# Patient Record
Sex: Female | Born: 1950
Health system: Southern US, Community
[De-identification: ages and names within clinical notes are randomized; demographics above are authoritative.]

## PROBLEM LIST (undated history)

## (undated) DIAGNOSIS — E785 Hyperlipidemia, unspecified: Secondary | ICD-10-CM

## (undated) DIAGNOSIS — Z9189 Other specified personal risk factors, not elsewhere classified: Secondary | ICD-10-CM

## (undated) DIAGNOSIS — I10 Essential (primary) hypertension: Secondary | ICD-10-CM

## (undated) DIAGNOSIS — Z91B Personal risk factor of exposure to diethylstilbestrol: Secondary | ICD-10-CM

## (undated) DIAGNOSIS — Z87442 Personal history of urinary calculi: Secondary | ICD-10-CM

## (undated) DIAGNOSIS — E08610 Diabetes mellitus due to underlying condition with diabetic neuropathic arthropathy: Secondary | ICD-10-CM

## (undated) DIAGNOSIS — E079 Disorder of thyroid, unspecified: Secondary | ICD-10-CM

## (undated) DIAGNOSIS — G629 Polyneuropathy, unspecified: Secondary | ICD-10-CM

## (undated) HISTORY — DX: Disorder of thyroid, unspecified: E07.9

## (undated) HISTORY — DX: Hyperlipidemia, unspecified: E78.5

## (undated) HISTORY — DX: Personal history of urinary calculi: Z87.442

## (undated) HISTORY — DX: Essential (primary) hypertension: I10

## (undated) HISTORY — DX: Diabetes mellitus due to underlying condition with diabetic neuropathic arthropathy: E08.610

## (undated) HISTORY — DX: Personal risk factor of exposure to diethylstilbestrol: Z91.B

## (undated) HISTORY — DX: Other specified personal risk factors, not elsewhere classified: Z91.89

---

## 1996-01-04 HISTORY — PX: LUMBAR DISC SURGERY: SHX700

## 1997-01-03 DIAGNOSIS — Z87442 Personal history of urinary calculi: Secondary | ICD-10-CM

## 1997-01-03 HISTORY — DX: Personal history of urinary calculi: Z87.442

## 1997-01-03 HISTORY — PX: LUMBAR FUSION: SHX111

## 2004-08-31 ENCOUNTER — Ambulatory Visit: Payer: Self-pay | Admitting: Family Medicine

## 2005-01-03 HISTORY — PX: COLONOSCOPY: SHX174

## 2005-03-28 ENCOUNTER — Ambulatory Visit: Payer: Self-pay | Admitting: Family Medicine

## 2005-04-13 ENCOUNTER — Ambulatory Visit: Payer: Self-pay | Admitting: Gastroenterology

## 2005-04-25 ENCOUNTER — Ambulatory Visit: Payer: Self-pay | Admitting: Gastroenterology

## 2005-05-04 ENCOUNTER — Encounter: Admission: RE | Admit: 2005-05-04 | Discharge: 2005-05-04 | Payer: Self-pay | Admitting: Family Medicine

## 2005-06-03 ENCOUNTER — Ambulatory Visit: Payer: Self-pay | Admitting: Family Medicine

## 2005-06-10 ENCOUNTER — Ambulatory Visit: Payer: Self-pay | Admitting: Family Medicine

## 2005-06-13 ENCOUNTER — Ambulatory Visit: Payer: Self-pay | Admitting: Family Medicine

## 2005-10-04 ENCOUNTER — Other Ambulatory Visit: Admission: RE | Admit: 2005-10-04 | Discharge: 2005-10-04 | Payer: Self-pay | Admitting: Internal Medicine

## 2008-03-11 ENCOUNTER — Encounter: Admission: RE | Admit: 2008-03-11 | Discharge: 2008-03-11 | Payer: Self-pay | Admitting: Internal Medicine

## 2008-03-18 ENCOUNTER — Other Ambulatory Visit: Admission: RE | Admit: 2008-03-18 | Discharge: 2008-03-18 | Payer: Self-pay | Admitting: Obstetrics and Gynecology

## 2008-03-19 ENCOUNTER — Encounter: Admission: RE | Admit: 2008-03-19 | Discharge: 2008-03-19 | Payer: Self-pay | Admitting: Internal Medicine

## 2008-12-16 ENCOUNTER — Encounter: Admission: RE | Admit: 2008-12-16 | Discharge: 2008-12-16 | Payer: Self-pay | Admitting: Internal Medicine

## 2009-07-30 ENCOUNTER — Encounter: Admission: RE | Admit: 2009-07-30 | Discharge: 2009-07-30 | Payer: Self-pay | Admitting: Internal Medicine

## 2009-12-17 ENCOUNTER — Encounter
Admission: RE | Admit: 2009-12-17 | Discharge: 2009-12-17 | Payer: Self-pay | Source: Home / Self Care | Attending: Internal Medicine | Admitting: Internal Medicine

## 2010-01-11 ENCOUNTER — Encounter
Admission: RE | Admit: 2010-01-11 | Discharge: 2010-01-11 | Payer: Self-pay | Source: Home / Self Care | Attending: Internal Medicine | Admitting: Internal Medicine

## 2010-02-03 ENCOUNTER — Encounter: Payer: Self-pay | Admitting: Internal Medicine

## 2010-09-15 LAB — HM PAP SMEAR: HM Pap smear: NORMAL

## 2012-04-23 ENCOUNTER — Encounter: Payer: Self-pay | Admitting: Nurse Practitioner

## 2012-04-23 ENCOUNTER — Ambulatory Visit (INDEPENDENT_AMBULATORY_CARE_PROVIDER_SITE_OTHER): Payer: Self-pay | Admitting: Nurse Practitioner

## 2012-04-23 VITALS — BP 136/80 | HR 88 | Ht 64.0 in | Wt 178.2 lb

## 2012-04-23 DIAGNOSIS — Z0000 Encounter for general adult medical examination without abnormal findings: Secondary | ICD-10-CM

## 2012-04-23 DIAGNOSIS — Z01419 Encounter for gynecological examination (general) (routine) without abnormal findings: Secondary | ICD-10-CM

## 2012-04-23 DIAGNOSIS — N390 Urinary tract infection, site not specified: Secondary | ICD-10-CM

## 2012-04-23 LAB — POCT URINALYSIS DIPSTICK
Leukocytes, UA: NEGATIVE
pH, UA: 6.5

## 2012-04-23 LAB — COMPREHENSIVE METABOLIC PANEL
ALT: 91 U/L — ABNORMAL HIGH (ref 0–35)
AST: 98 U/L — ABNORMAL HIGH (ref 0–37)
Albumin: 4.9 g/dL (ref 3.5–5.2)
Alkaline Phosphatase: 71 U/L (ref 39–117)
BUN: 11 mg/dL (ref 6–23)
Calcium: 10.4 mg/dL (ref 8.4–10.5)
Chloride: 100 mEq/L (ref 96–112)
Potassium: 4.1 mEq/L (ref 3.5–5.3)
Sodium: 138 mEq/L (ref 135–145)
Total Protein: 7.8 g/dL (ref 6.0–8.3)

## 2012-04-23 LAB — HEMOGLOBIN A1C
Hgb A1c MFr Bld: 6.5 % — ABNORMAL HIGH (ref <5.7)
Mean Plasma Glucose: 140 mg/dL — ABNORMAL HIGH (ref <117)

## 2012-04-23 LAB — LIPID PANEL
HDL: 71 mg/dL (ref >39)
LDL Cholesterol: 168 mg/dL — ABNORMAL HIGH (ref 0–99)

## 2012-04-23 MED ORDER — NITROFURANTOIN MONOHYD MACRO 100 MG PO CAPS: 100.0000 mg | ORAL_CAPSULE | Freq: Two times a day (BID) | ORAL | Status: DC

## 2012-04-23 NOTE — Patient Instructions (Addendum)
EXERCISE AND DIET:  We recommended that you start or continue a regular exercise program for good health. Regular exercise means any activity that makes your heart beat faster and makes you sweat.  We recommend exercising at least 30 minutes per day at least 3 days a week, preferably 4 or 5.  We also recommend a diet low in fat and sugar.  Inactivity, poor dietary choices and obesity can cause diabetes, heart attack, stroke, and kidney damage, among others.    ALCOHOL AND SMOKING:  Women should limit their alcohol intake to no more than 7 drinks/beers/glasses of wine (combined, not each!) per week. Moderation of alcohol intake to this level decreases your risk of breast cancer and liver damage. And of course, no recreational drugs are part of a healthy lifestyle.  And absolutely no smoking or even second hand smoke. Most people know smoking can cause heart and lung diseases, but did you know it also contributes to weakening of your bones? Aging of your skin?  Yellowing of your teeth and nails?  CALCIUM AND VITAMIN D:  Adequate intake of calcium and Vitamin D are recommended.  The recommendations for exact amounts of these supplements seem to change often, but generally speaking 600 mg of calcium (either carbonate or citrate) and 800 units of Vitamin D per day seems prudent. Certain women may benefit from higher intake of Vitamin D.  If you are among these women, your doctor will have told you during your visit.    PAP SMEARS:  Pap smears, to check for cervical cancer or precancers,  have traditionally been done yearly, although recent scientific advances have shown that most women can have pap smears less often.  However, every woman still should have a physical exam from her gynecologist every year. It will include a breast check, inspection of the vulva and vagina to check for abnormal growths or skin changes, a visual exam of the cervix, and then an exam to evaluate the size and shape of the uterus and  ovaries.  And after 64 years of age, a rectal exam is indicated to check for rectal cancers. We will also provide age appropriate advice regarding health maintenance, like when you should have certain vaccines, screening for sexually transmitted diseases, bone density testing, colonoscopy, mammograms, etc.   MAMMOGRAMS:  All women over 40 years old should have a yearly mammogram. Many facilities now offer a "3D" mammogram, which may cost around $50 extra out of pocket. If possible,  we recommend you accept the option to have the 3D mammogram performed.  It both reduces the number of women who will be called back for extra views which then turn out to be normal, and it is better than the routine mammogram at detecting truly abnormal areas.    COLONOSCOPY:  Colonoscopy to screen for colon cancer is recommended for all women at age 50.  We know, you hate the idea of the prep.  We agree, BUT, having colon cancer and not knowing it is worse!!  Colon cancer so often starts as a polyp that can be seen and removed at colonscopy, which can quite literally save your life!  And if your first colonoscopy is normal and you have no family history of colon cancer, most women don't have to have it again for 10 years.  Once every ten years, you can do something that may end up saving your life, right?  We will be happy to help you get it scheduled when you are ready.    Be sure to check your insurance coverage so you understand how much it will cost.  It may be covered as a preventative service at no cost, but you should check your particular policy.    Urinary Tract Infection Urinary tract infections (UTIs) can develop anywhere along your urinary tract. Your urinary tract is your body's drainage system for removing wastes and extra water. Your urinary tract includes two kidneys, two ureters, a bladder, and a urethra. Your kidneys are a pair of bean-shaped organs. Each kidney is about the size of your fist. They are located  below your ribs, one on each side of your spine. CAUSES Infections are caused by microbes, which are microscopic organisms, including fungi, viruses, and bacteria. These organisms are so small that they can only be seen through a microscope. Bacteria are the microbes that most commonly cause UTIs. SYMPTOMS  Symptoms of UTIs may vary by age and gender of the patient and by the location of the infection. Symptoms in young women typically include a frequent and intense urge to urinate and a painful, burning feeling in the bladder or urethra during urination. Older women and men are more likely to be tired, shaky, and weak and have muscle aches and abdominal pain. A fever may mean the infection is in your kidneys. Other symptoms of a kidney infection include pain in your back or sides below the ribs, nausea, and vomiting. DIAGNOSIS To diagnose a UTI, your caregiver will ask you about your symptoms. Your caregiver also will ask to provide a urine sample. The urine sample will be tested for bacteria and white blood cells. White blood cells are made by your body to help fight infection. TREATMENT  Typically, UTIs can be treated with medication. Because most UTIs are caused by a bacterial infection, they usually can be treated with the use of antibiotics. The choice of antibiotic and length of treatment depend on your symptoms and the type of bacteria causing your infection. HOME CARE INSTRUCTIONS  If you were prescribed antibiotics, take them exactly as your caregiver instructs you. Finish the medication even if you feel better after you have only taken some of the medication.  Drink enough water and fluids to keep your urine clear or pale yellow.  Avoid caffeine, tea, and carbonated beverages. They tend to irritate your bladder.  Empty your bladder often. Avoid holding urine for long periods of time.  Empty your bladder before and after sexual intercourse.  After a bowel movement, women should cleanse  from front to back. Use each tissue only once. SEEK MEDICAL CARE IF:   You have back pain.  You develop a fever.  Your symptoms do not begin to resolve within 3 days. SEEK IMMEDIATE MEDICAL CARE IF:   You have severe back pain or lower abdominal pain.  You develop chills.  You have nausea or vomiting.  You have continued burning or discomfort with urination. MAKE SURE YOU:   Understand these instructions.  Will watch your condition.  Will get help right away if you are not doing well or get worse. Document Released: 09/29/2004 Document Revised: 06/21/2011 Document Reviewed: 01/28/2011 [facility] Patient Information 2013 [place], [place].

## 2012-04-23 NOTE — Progress Notes (Signed)
62 y.o. G3P3000 Single Caucasian Fe here for annual exam.  Currently having several health issues.  She has noted urinary urgency, frequency , and dysuria for about a week.  Denies fever, chills, flank pain. Sometimes symptoms are better with an increase in fluids. She is also having a flare of back pain with radiation to lower extremities since getting ready for a move.  For her back pain she has taken Aleve and Flexeril with relief.   She built onto her daughters home and now they are moving out.  She has to find some place else to live. She also has been off some medication (diabetes, cholesterol and thyroid) due to insurance coverage.  Living on Social Security since 2006.  Hopes to get on Medicare in December.  Has tried to get disability due to back pain. She is requesting labs today.  No LMP recorded.    Postmenopausal since 2002      Sexually active: no  The current method of family planning is tubal ligation.    Exercising: no  The patient does not participate in regular exercise at present. Smoker:  yes  Health Maintenance: Pap:  09/24/2010  MMG:  2012 (will schedule in December when she has insurance coverage) Colonoscopy: 2007 repeat in 10 years BMD:   2012 TDaP:  09/2010 Labs: UA and Urine culture  * reports that she has been smoking.  She does not have any smokeless tobacco history on file.  Past Medical History  Diagnosis Date  . 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 Thyroid disease age 56    controlled on meds  . Diabetes mellitus due to underlying condition with diabetic neuropathic arthropathy age 5  . Hyperlipidemia   . Hypertension     on meds  . H/O renal calculi     Past Surgical History  Procedure Laterality Date  . Lumbar disc surgery  1998  . Lumbar fusion  1999    lumbar  . Colonoscopy  2007    negative, recheck 10 years    Current Outpatient Prescriptions  Medication Sig Dispense Refill  . aspirin 500 MG tablet Take 500 mg by  mouth every 6 (six) hours as needed for pain.      Marland Kitchen gabapentin (NEURONTIN) 600 MG tablet Take 600 mg by mouth 3 (three) times daily.      Marland Kitchen levothyroxine (SYNTHROID, LEVOTHROID) 100 MCG tablet Take 100 mcg by mouth daily before breakfast.      . metFORMIN (GLUMETZA) 500 MG (MOD) 24 hr tablet Take 500 mg by mouth 2 (two) times daily with a meal.      . simvastatin (ZOCOR) 20 MG tablet Take 20 mg by mouth every evening.       No current facility-administered medications for this visit.    Family History  Problem Relation Age of Onset  . ALS Mother   . Stroke Father   . Hypertension Father     ROS:  Pertinent items are noted in HPI.  Otherwise, a comprehensive ROS was negative.  Exam:   There were no vitals taken for this visit.       Ht Readings from Last 3 Encounters:  No data found for Ht    General appearance: alert, cooperative and appears stated age Head: Normocephalic, without obvious abnormality, atraumatic Neck: no adenopathy, supple, symmetrical, trachea midline and thyroid normal to inspection and palpation Lungs: clear to auscultation bilaterally Breasts: normal appearance, no masses or tenderness  Heart: regular rate and rhythm Abdomen: soft, non-tender; no masses,  no organomegaly Extremities: extremities normal, atraumatic, no cyanosis or edema Skin: Skin color, texture, turgor normal. No rashes or lesions Lymph nodes: Cervical, supraclavicular, and axillary nodes normal. No abnormal inguinal nodes palpated Neurologic: Grossly normal   Pelvic: External genitalia:  no lesions              Urethra:  atrophic appearing urethra with no masses, tenderness or lesions              Bartholin's and Skene's: normal                 Vagina: very atrophic appearing vagina with pale color and no discharge, no lesions              Cervix: anteverted              Pap taken: yes atrophic smear with HR HPV Bimanual Exam:  Uterus:  normal size, contour, position, consistency,  mobility, non-tender              Adnexa: normal adnexa               Rectovaginal: Confirms               Anus:  normal sphincter tone, no lesions  A:  Well Woman with normal exam  Postmenopausal  Not sexually active in years  R/O UTI  Lapse in health care  History of chronic back pain with current flare  History of Diabetes, HTN, hypothyroid with non compliance to all med's secondary to no insurance coverage  P:   Mammogram to be done in December  pap smear as per guidelines  Encouraged to seek medical care at PCP for back  pain and continued care for other long term illness   return annually or prn  An After Visit Summary was printed and given to the patient.

## 2012-04-24 LAB — HEMOGLOBIN, FINGERSTICK: Hemoglobin, fingerstick: 16.4 g/dL — ABNORMAL HIGH (ref 12.0–16.0)

## 2012-04-24 NOTE — Progress Notes (Signed)
Encounter reviewed by Dr. Abagail Limb Silva.  

## 2012-04-25 LAB — URINE CULTURE: Colony Count: >=100000

## 2012-04-27 ENCOUNTER — Telehealth: Payer: Self-pay | Admitting: Nurse Practitioner

## 2012-04-27 DIAGNOSIS — E119 Type 2 diabetes mellitus without complications: Secondary | ICD-10-CM

## 2012-04-27 DIAGNOSIS — E7800 Pure hypercholesterolemia, unspecified: Secondary | ICD-10-CM

## 2012-04-27 DIAGNOSIS — E039 Hypothyroidism, unspecified: Secondary | ICD-10-CM

## 2012-04-27 MED ORDER — SIMVASTATIN 20 MG PO TABS: 20.0000 mg | ORAL_TABLET | Freq: Every evening | ORAL | Status: DC

## 2012-04-27 MED ORDER — METFORMIN HCL ER (MOD) 500 MG PO TB24: 500.0000 mg | ORAL_TABLET | Freq: Two times a day (BID) | ORAL | Status: AC

## 2012-04-27 MED ORDER — LEVOTHYROXINE SODIUM 100 MCG PO TABS: 100.0000 ug | ORAL_TABLET | Freq: Every day | ORAL | Status: DC

## 2012-04-27 NOTE — Telephone Encounter (Signed)
Patient given test result of elevated glucose, elevated lipid panel, abnormal LFT's and elevated thyroid of 70.229.  Urine culture was sensitive to Macrobid and she will continue with med's.  I proposed for her to go to [facility] for urgent medical care since she has no insurance.  She is given the phone number and operating hours.  She then says she has appointment on May th with Dr. Hyacinth Meeker at [facility] and wants to go there  Instead.  will request a refill on her med's until she can be seen there.  Only 30 days of med's : Glucophage, Levothyroxine and Zocor is given.  Sent through orders to [facility].

## 2012-04-27 NOTE — Telephone Encounter (Signed)
Patty,  Thanks for taking great care of your patient!  [employer]

## 2012-05-03 ENCOUNTER — Encounter: Payer: Self-pay | Admitting: Nurse Practitioner

## 2012-05-04 ENCOUNTER — Other Ambulatory Visit: Payer: Self-pay | Admitting: Obstetrics and Gynecology

## 2012-05-04 MED ORDER — CIPROFLOXACIN HCL 500 MG PO TABS: 500.0000 mg | ORAL_TABLET | Freq: Two times a day (BID) | ORAL | Status: DC

## 2012-11-08 ENCOUNTER — Other Ambulatory Visit: Payer: Self-pay

## 2013-04-25 ENCOUNTER — Encounter: Payer: Self-pay | Admitting: Nurse Practitioner

## 2013-04-25 ENCOUNTER — Ambulatory Visit: Payer: Self-pay | Admitting: Nurse Practitioner

## 2013-11-04 ENCOUNTER — Encounter: Payer: Self-pay | Admitting: Nurse Practitioner

## 2014-06-30 ENCOUNTER — Other Ambulatory Visit: Payer: Self-pay

## 2020-03-17 ENCOUNTER — Emergency Department (HOSPITAL_COMMUNITY): Payer: Medicare Other

## 2020-03-17 ENCOUNTER — Encounter (HOSPITAL_COMMUNITY): Payer: Self-pay | Admitting: Emergency Medicine

## 2020-03-17 ENCOUNTER — Emergency Department (HOSPITAL_COMMUNITY)
Admission: EM | Admit: 2020-03-17 | Discharge: 2020-03-18 | Disposition: A | Discharge status: Home / Self Care | Payer: Medicare Other | Attending: Emergency Medicine | Admitting: Emergency Medicine

## 2020-03-17 ENCOUNTER — Other Ambulatory Visit: Payer: Self-pay

## 2020-03-17 DIAGNOSIS — Z7984 Long term (current) use of oral hypoglycemic drugs: Secondary | ICD-10-CM | POA: Diagnosis not present

## 2020-03-17 DIAGNOSIS — E11610 Type 2 diabetes mellitus with diabetic neuropathic arthropathy: Secondary | ICD-10-CM | POA: Insufficient documentation

## 2020-03-17 DIAGNOSIS — N3090 Cystitis, unspecified without hematuria: Secondary | ICD-10-CM

## 2020-03-17 DIAGNOSIS — I714 Abdominal aortic aneurysm, without rupture, unspecified: Secondary | ICD-10-CM

## 2020-03-17 DIAGNOSIS — R109 Unspecified abdominal pain: Secondary | ICD-10-CM | POA: Insufficient documentation

## 2020-03-17 DIAGNOSIS — R1011 Right upper quadrant pain: Secondary | ICD-10-CM

## 2020-03-17 DIAGNOSIS — Z79899 Other long term (current) drug therapy: Secondary | ICD-10-CM | POA: Insufficient documentation

## 2020-03-17 DIAGNOSIS — M545 Low back pain, unspecified: Secondary | ICD-10-CM | POA: Diagnosis not present

## 2020-03-17 DIAGNOSIS — R270 Ataxia, unspecified: Secondary | ICD-10-CM | POA: Diagnosis not present

## 2020-03-17 DIAGNOSIS — N3000 Acute cystitis without hematuria: Secondary | ICD-10-CM | POA: Diagnosis not present

## 2020-03-17 DIAGNOSIS — I700 Atherosclerosis of aorta: Secondary | ICD-10-CM | POA: Diagnosis not present

## 2020-03-17 DIAGNOSIS — Z7982 Long term (current) use of aspirin: Secondary | ICD-10-CM | POA: Insufficient documentation

## 2020-03-17 DIAGNOSIS — I10 Essential (primary) hypertension: Secondary | ICD-10-CM | POA: Insufficient documentation

## 2020-03-17 DIAGNOSIS — M48061 Spinal stenosis, lumbar region without neurogenic claudication: Secondary | ICD-10-CM | POA: Diagnosis not present

## 2020-03-17 DIAGNOSIS — F17200 Nicotine dependence, unspecified, uncomplicated: Secondary | ICD-10-CM | POA: Insufficient documentation

## 2020-03-17 DIAGNOSIS — M549 Dorsalgia, unspecified: Secondary | ICD-10-CM | POA: Diagnosis not present

## 2020-03-17 DIAGNOSIS — M5124 Other intervertebral disc displacement, thoracic region: Secondary | ICD-10-CM | POA: Diagnosis not present

## 2020-03-17 DIAGNOSIS — M5126 Other intervertebral disc displacement, lumbar region: Secondary | ICD-10-CM | POA: Diagnosis not present

## 2020-03-17 HISTORY — DX: Polyneuropathy, unspecified: G62.9

## 2020-03-17 LAB — BASIC METABOLIC PANEL
Anion gap: 10 (ref 5–15)
BUN: 10 mg/dL (ref 8–23)
CO2: 25 mmol/L (ref 22–32)
Calcium: 9.7 mg/dL (ref 8.9–10.3)
Chloride: 102 mmol/L (ref 98–111)
Creatinine, Ser: 1.04 mg/dL — ABNORMAL HIGH (ref 0.44–1.00)
GFR, Estimated: 57 mL/min — ABNORMAL LOW (ref >60)
Glucose, Bld: 123 mg/dL — ABNORMAL HIGH (ref 70–99)
Potassium: 3.2 mmol/L — ABNORMAL LOW (ref 3.5–5.1)
Sodium: 137 mmol/L (ref 135–145)

## 2020-03-17 LAB — CBC WITH DIFFERENTIAL/PLATELET
Abs Immature Granulocytes: 0.04 10*3/uL (ref 0.00–0.07)
Basophils Absolute: 0.1 10*3/uL (ref 0.0–0.1)
Basophils Relative: 1 %
Eosinophils Absolute: 0.1 10*3/uL (ref 0.0–0.5)
Eosinophils Relative: 1 %
HCT: 43.1 % (ref 36.0–46.0)
Hemoglobin: 14.2 g/dL (ref 12.0–15.0)
Immature Granulocytes: 0 %
Lymphocytes Relative: 19 %
Lymphs Abs: 2.4 10*3/uL (ref 0.7–4.0)
MCH: 29.2 pg (ref 26.0–34.0)
MCHC: 32.9 g/dL (ref 30.0–36.0)
MCV: 88.5 fL (ref 80.0–100.0)
Monocytes Absolute: 1.1 10*3/uL — ABNORMAL HIGH (ref 0.1–1.0)
Monocytes Relative: 8 %
Neutro Abs: 8.9 10*3/uL — ABNORMAL HIGH (ref 1.7–7.7)
Neutrophils Relative %: 71 %
Platelets: 294 10*3/uL (ref 150–400)
RBC: 4.87 MIL/uL (ref 3.87–5.11)
RDW: 14.7 % (ref 11.5–15.5)
WBC: 12.6 10*3/uL — ABNORMAL HIGH (ref 4.0–10.5)
nRBC: 0 % (ref 0.0–0.2)

## 2020-03-17 MED ORDER — HYDRALAZINE HCL 20 MG/ML IJ SOLN
10.0000 mg | Freq: Once | INTRAMUSCULAR | Status: AC
  Administered 2020-03-17: 10 mg via INTRAVENOUS
  Filled 2020-03-17: qty 1

## 2020-03-17 MED ORDER — MORPHINE SULFATE (PF) 4 MG/ML IV SOLN
4.0000 mg | Freq: Once | INTRAVENOUS | Status: AC
  Administered 2020-03-17: 4 mg via INTRAVENOUS
  Filled 2020-03-17: qty 1

## 2020-03-17 MED ORDER — ONDANSETRON HCL 4 MG/2ML IJ SOLN
4.0000 mg | Freq: Once | INTRAMUSCULAR | Status: AC
  Administered 2020-03-17: 4 mg via INTRAVENOUS
  Filled 2020-03-17: qty 2

## 2020-03-17 NOTE — ED Provider Notes (Incomplete)
[facility] EMERGENCY DEPARTMENT Provider Note   CSN: [id number] Arrival date & time: 03/17/20  1843     History Chief Complaint  Patient presents with  . Back Pain    Abigail Ruiz is a 70 y.o. female history of hypertension, hyperlipidemia, previous spinal surgeries done in your care presenting with right-sided back pain and area of numbness and paresthesias for the last 2 weeks.  Patient states that it is sharp and also she has some decreased sensation at the same time the right flank area.  Patient is not currently on any pain medicine.  She states that she was on gabapentin but did not tolerate it. Patient denies any recent falls.  She states that she fell several months ago.  She denies any trouble walking. Patient denies any trouble urinating or chest pain.  Patient denies any rash.  Patient was noted to be in severe pain and hypertensive.  Denies any tearing chest pain or abdominal pain  The history is provided by the patient.       Past Medical History:  Diagnosis Date  . DES exposure in utero born 25-Mar-1950   Mother took DES  . Diabetes mellitus due to underlying condition with diabetic neuropathic arthropathy [facility]) age 45  . H/O renal calculi 1999  . Hyperlipidemia   . Hypertension    borderline  . Neuropathy   . Thyroid disease age 24   controlled on meds    There are no problems to display for this patient.   Past Surgical History:  Procedure Laterality Date  . COLONOSCOPY  2007   negative, recheck 10 years  . LUMBAR DISC SURGERY  1998  . LUMBAR FUSION  1999   lumbar     OB History    Gravida  3   Para  3   Term  3   Preterm      AB      Living        SAB      IAB      Ectopic      Multiple      Live Births              Family History  Problem Relation Age of Onset  . ALS Mother   . Stroke Father   . Hypertension Father     Social History   Tobacco Use  . Smoking status: Current Every Day Smoker  .  Smokeless tobacco: Former Neurosurgeon    Quit date: 04/03/2012  Substance Use Topics  . Alcohol use: No  . Drug use: No    Home Medications Prior to Admission medications   Medication Sig Start Date End Date Taking? Authorizing Provider  aspirin 500 MG tablet Take 500 mg by mouth every 6 (six) hours as needed for pain.    [provider]  ciprofloxacin (CIPRO) 500 MG tablet Take 1 tablet (500 mg total) by mouth 2 (two) times daily. Take twice a day for 7 days. 05/04/12   Patton Salles, MD  gabapentin (NEURONTIN) 600 MG tablet Take 600 mg by mouth 3 (three) times daily.    [provider]  levothyroxine (SYNTHROID, LEVOTHROID) 100 MCG tablet Take 1 tablet (100 mcg total) by mouth daily before breakfast. 04/27/12   Ria Comment, FNP  metFORMIN (GLUMETZA) 500 MG (MOD) 24 hr tablet Take 1 tablet (500 mg total) by mouth 2 (two) times daily with a meal. 04/27/12   Ria Comment, FNP  Naproxen Sodium (ALEVE) 220 MG CAPS Take by mouth daily.    [provider]  nitrofurantoin, macrocrystal-monohydrate, (MACROBID) 100 MG capsule Take 1 capsule (100 mg total) by mouth 2 (two) times daily. 04/23/12   Ria Comment, FNP  simvastatin (ZOCOR) 20 MG tablet Take 1 tablet (20 mg total) by mouth every evening. 04/27/12   Ria Comment, FNP    Allergies    Ivp dye [iodinated diagnostic agents]  Review of Systems   Review of Systems  Musculoskeletal: Positive for back pain.  All other systems reviewed and are negative.   Physical Exam Updated Vital Signs BP (!) 223/93 (BP Location: Right Arm)   Pulse 92   Temp 98.4 F (36.9 C)   Resp 16   LMP 04/24/2003   SpO2 98%   Physical Exam Vitals and nursing note reviewed.  Constitutional:      Appearance: Normal appearance.     Comments: Uncomfortable.  Neurological:     Mental Status: She is alert.     ED Results / Procedures / Treatments   Labs (all labs ordered are listed, but only abnormal results are  displayed) Labs Reviewed  CBC WITH DIFFERENTIAL/PLATELET  BASIC METABOLIC PANEL    EKG None  Radiology DG Lumbar Spine Complete  Result Date: 03/17/2020 CLINICAL DATA:  Back pain EXAM: LUMBAR SPINE - COMPLETE 4+ VIEW COMPARISON:  None. FINDINGS: Lumbar alignment within normal limits. Posterior spinal rods L3 through S1 with fixating screws at L3-L5 and S1. Hardware appears grossly intact. Vertebral body heights are maintained. Advanced degenerative change L2-L3 with moderate degenerative changes at L1-L2. IMPRESSION: Postsurgical changes L3 through S1. No acute osseous abnormality. Advanced degenerative changes at L2-L3 Electronically Signed   By: Jasmine Pang M.D.   On: 03/17/2020 22:07    Procedures Procedures {Remember to document critical care time when appropriate:1}  Medications Ordered in ED Medications  morphine 4 MG/ML injection 4 mg (4 mg Intravenous Given 03/17/20 2238)  ondansetron (ZOFRAN) injection 4 mg (4 mg Intravenous Given 03/17/20 2239)  hydrALAZINE (APRESOLINE) injection 10 mg (10 mg Intravenous Given 03/17/20 2242)    ED Course  I have reviewed the triage vital signs and the nursing notes.  Pertinent labs & imaging results that were available during my care of the patient were reviewed by me and considered in my medical decision making (see chart for details).    MDM Rules/Calculators/A&P                          *** Final Clinical Impression(s) / ED Diagnoses Final diagnoses:  None    Rx / DC Orders ED Discharge Orders    None

## 2020-03-17 NOTE — ED Provider Notes (Signed)
[facility] EMERGENCY DEPARTMENT Provider Note   CSN: [id number] Arrival date & time: 03/17/20  1843     History Chief Complaint  Patient presents with  . Back Pain    Abigail Ruiz is a 70 y.o. female history of hypertension, hyperlipidemia, previous spinal surgeries done in your care presenting with right-sided back pain and area of numbness and paresthesias for the last 2 weeks.  Patient states that it is sharp and also she has some decreased sensation at the same time the right flank area.  Patient is not currently on any pain medicine.  She states that she was on gabapentin but did not tolerate it. Patient denies any recent falls.  She states that she fell several months ago.  She denies any trouble walking. Patient denies any trouble urinating or chest pain.  Patient denies any rash.  Patient was noted to be in severe pain and hypertensive.  Denies any tearing chest pain or abdominal pain  The history is provided by the patient.       Past Medical History:  Diagnosis Date  . DES exposure in utero born [birth year]   Mother took DES  . Diabetes mellitus due to underlying condition with diabetic neuropathic arthropathy [facility]) age 9  . H/O renal calculi 1999  . Hyperlipidemia   . Hypertension    borderline  . Neuropathy   . Thyroid disease age 4   controlled on meds    There are no problems to display for this patient.   Past Surgical History:  Procedure Laterality Date  . COLONOSCOPY  2007   negative, recheck 10 years  . LUMBAR DISC SURGERY  1998  . LUMBAR FUSION  1999   lumbar     OB History    Gravida  3   Para  3   Term  3   Preterm      AB      Living        SAB      IAB      Ectopic      Multiple      Live Births              Family History  Problem Relation Age of Onset  . ALS Mother   . Stroke Father   . Hypertension Father     Social History   Tobacco Use  . Smoking status: Current Every Day Smoker  .  Smokeless tobacco: Former Neurosurgeon    Quit date: 04/03/2012  Substance Use Topics  . Alcohol use: No  . Drug use: No    Home Medications Prior to Admission medications   Medication Sig Start Date End Date Taking? Authorizing Provider  aspirin 500 MG tablet Take 500 mg by mouth every 6 (six) hours as needed for pain.    [provider]  ciprofloxacin (CIPRO) 500 MG tablet Take 1 tablet (500 mg total) by mouth 2 (two) times daily. Take twice a day for 7 days. 05/04/12   Patton Salles, MD  gabapentin (NEURONTIN) 600 MG tablet Take 600 mg by mouth 3 (three) times daily.    [provider]  levothyroxine (SYNTHROID, LEVOTHROID) 100 MCG tablet Take 1 tablet (100 mcg total) by mouth daily before breakfast. 04/27/12   Ria Comment, FNP  metFORMIN (GLUMETZA) 500 MG (MOD) 24 hr tablet Take 1 tablet (500 mg total) by mouth 2 (two) times daily with a meal. 04/27/12   Ria Comment, FNP  Naproxen Sodium (ALEVE) 220 MG CAPS Take by mouth daily.    [provider]  nitrofurantoin, macrocrystal-monohydrate, (MACROBID) 100 MG capsule Take 1 capsule (100 mg total) by mouth 2 (two) times daily. 04/23/12   Ria Comment, FNP  simvastatin (ZOCOR) 20 MG tablet Take 1 tablet (20 mg total) by mouth every evening. 04/27/12   Ria Comment, FNP    Allergies    Ivp dye [iodinated diagnostic agents]  Review of Systems   Review of Systems  Musculoskeletal: Positive for back pain.  All other systems reviewed and are negative.   Physical Exam Updated Vital Signs BP (!) 223/93 (BP Location: Right Arm)   Pulse 92   Temp 98.4 F (36.9 C)   Resp 16   LMP 04/24/2003   SpO2 98%   Physical Exam Vitals and nursing note reviewed.  Constitutional:      Appearance: Normal appearance.     Comments: Uncomfortable.  HENT:     Head: Normocephalic.     Nose: Nose normal.     Mouth/Throat:     Mouth: Mucous membranes are moist.  Eyes:     Pupils: Pupils are equal, round, and  reactive to light.  Cardiovascular:     Rate and Rhythm: Normal rate and regular rhythm.     Pulses: Normal pulses.     Heart sounds: Normal heart sounds.  Pulmonary:     Effort: Pulmonary effort is normal.     Breath sounds: Normal breath sounds.  Abdominal:     General: Abdomen is flat.     Palpations: Abdomen is soft.  Musculoskeletal:        General: Normal range of motion.     Cervical back: Normal range of motion and neck supple.  Skin:    General: Skin is warm.     Capillary Refill: Capillary refill takes less than 2 seconds.  Neurological:     General: No focal deficit present.     Mental Status: She is alert and oriented to person, place, and time.     Comments: Patient has no obvious facial droop.  Patient has decreased sensation in the right side and the T10-T12 distribution.  There is no obvious vesicular rash.  Patient has no saddle anesthesia.  Patient has normal reflexes bilateral lower extremities.  Patient has normal pulses in all extremities   Psychiatric:        Mood and Affect: Mood normal.        Behavior: Behavior normal.     ED Results / Procedures / Treatments   Labs (all labs ordered are listed, but only abnormal results are displayed) Labs Reviewed  CBC WITH DIFFERENTIAL/PLATELET  BASIC METABOLIC PANEL    EKG None  Radiology DG Lumbar Spine Complete  Result Date: 03/17/2020 CLINICAL DATA:  Back pain EXAM: LUMBAR SPINE - COMPLETE 4+ VIEW COMPARISON:  None. FINDINGS: Lumbar alignment within normal limits. Posterior spinal rods L3 through S1 with fixating screws at L3-L5 and S1. Hardware appears grossly intact. Vertebral body heights are maintained. Advanced degenerative change L2-L3 with moderate degenerative changes at L1-L2. IMPRESSION: Postsurgical changes L3 through S1. No acute osseous abnormality. Advanced degenerative changes at L2-L3 Electronically Signed   By: Jasmine Pang M.D.   On: 03/17/2020 22:07    Procedures Procedures    Medications Ordered in ED Medications  morphine 4 MG/ML injection 4 mg (4 mg Intravenous Given 03/17/20 2238)  ondansetron (ZOFRAN) injection 4 mg (4 mg Intravenous Given 03/17/20 2239)  hydrALAZINE (APRESOLINE) injection  10 mg (10 mg Intravenous Given 03/17/20 2242)    ED Course  I have reviewed the triage vital signs and the nursing notes.  Pertinent labs & imaging results that were available during my care of the patient were reviewed by me and considered in my medical decision making (see chart for details).    MDM Rules/Calculators/A&P                         Abigail Ruiz is a 70 y.o. female here presenting with right-sided back pain and flank pain.  This is going on for about 2 weeks.  Patient has pain even with light touch.  This is in the T10-12 distribution on the right side.  There is no obvious rash to suggest shingles.  She has previous lumbar surgeries.  I wonder if this is a radiculopathy from the thoracic area.  We will get MRI thoracic and lumbar spine. Patient is hypertensive but she is in pain. Will give pain medicine and reassess.  I doubt dissection at this been gone for 2 weeks.  12:05 AM MRI pending. Signed out to Dr. Bebe Shaggy in the ED to follow up MRI. If negative, anticipate dc home with pain meds, neurosurgery follow up    Final Clinical Impression(s) / ED Diagnoses Final diagnoses:  None    Rx / DC Orders ED Discharge Orders    None       Charlynne Pander, MD 03/18/20 0006

## 2020-03-17 NOTE — ED Triage Notes (Signed)
Pt reports "severe back pain all over".   She states she has neuropathy and cannot feel anything on the R side of her back if you "stick her with pins" but states she can't stand for anything to touch it.  Denies any medical history other than neuropathy but previous chart does show medical history.

## 2020-03-18 ENCOUNTER — Emergency Department (HOSPITAL_COMMUNITY): Payer: Medicare Other

## 2020-03-18 DIAGNOSIS — I714 Abdominal aortic aneurysm, without rupture: Secondary | ICD-10-CM | POA: Diagnosis not present

## 2020-03-18 DIAGNOSIS — R1011 Right upper quadrant pain: Secondary | ICD-10-CM | POA: Diagnosis not present

## 2020-03-18 DIAGNOSIS — R270 Ataxia, unspecified: Secondary | ICD-10-CM | POA: Diagnosis not present

## 2020-03-18 DIAGNOSIS — M5124 Other intervertebral disc displacement, thoracic region: Secondary | ICD-10-CM | POA: Diagnosis not present

## 2020-03-18 DIAGNOSIS — I700 Atherosclerosis of aorta: Secondary | ICD-10-CM | POA: Diagnosis not present

## 2020-03-18 DIAGNOSIS — N3000 Acute cystitis without hematuria: Secondary | ICD-10-CM | POA: Diagnosis not present

## 2020-03-18 DIAGNOSIS — M48061 Spinal stenosis, lumbar region without neurogenic claudication: Secondary | ICD-10-CM | POA: Diagnosis not present

## 2020-03-18 DIAGNOSIS — M5126 Other intervertebral disc displacement, lumbar region: Secondary | ICD-10-CM | POA: Diagnosis not present

## 2020-03-18 LAB — URINALYSIS, ROUTINE W REFLEX MICROSCOPIC
Bilirubin Urine: NEGATIVE
Glucose, UA: NEGATIVE mg/dL
Hgb urine dipstick: NEGATIVE
Ketones, ur: NEGATIVE mg/dL
Nitrite: POSITIVE — AB
Protein, ur: 30 mg/dL — AB
Specific Gravity, Urine: 1.006 (ref 1.005–1.030)
WBC, UA: >50 WBC/hpf — ABNORMAL HIGH (ref 0–5)
pH: 6 (ref 5.0–8.0)

## 2020-03-18 LAB — HEPATIC FUNCTION PANEL
ALT: 6 U/L (ref 0–44)
AST: 15 U/L (ref 15–41)
Albumin: 3.9 g/dL (ref 3.5–5.0)
Alkaline Phosphatase: 82 U/L (ref 38–126)
Bilirubin, Direct: <0.1 mg/dL (ref 0.0–0.2)
Total Bilirubin: 0.9 mg/dL (ref 0.3–1.2)
Total Protein: 7.8 g/dL (ref 6.5–8.1)

## 2020-03-18 LAB — LIPASE, BLOOD: Lipase: 22 U/L (ref 11–51)

## 2020-03-18 MED ORDER — LACTATED RINGERS IV BOLUS
1000.0000 mL | Freq: Once | INTRAVENOUS | Status: AC
  Administered 2020-03-18: 1000 mL via INTRAVENOUS

## 2020-03-18 MED ORDER — ONDANSETRON HCL 4 MG/2ML IJ SOLN
4.0000 mg | Freq: Once | INTRAMUSCULAR | Status: AC
  Administered 2020-03-18: 4 mg via INTRAVENOUS
  Filled 2020-03-18: qty 2

## 2020-03-18 MED ORDER — HYDROCODONE-ACETAMINOPHEN 5-325 MG PO TABS: 1.0000 | ORAL_TABLET | Freq: Four times a day (QID) | ORAL | 0 refills | Status: DC | PRN

## 2020-03-18 MED ORDER — FENTANYL CITRATE (PF) 100 MCG/2ML IJ SOLN
100.0000 ug | Freq: Once | INTRAMUSCULAR | Status: AC
Start: 2020-03-18 — End: 2020-03-18
  Administered 2020-03-18: 100 ug via INTRAVENOUS
  Filled 2020-03-18: qty 2

## 2020-03-18 MED ORDER — CEPHALEXIN 500 MG PO CAPS: 500.0000 mg | ORAL_CAPSULE | Freq: Three times a day (TID) | ORAL | 0 refills | Status: DC

## 2020-03-18 MED ORDER — HYDROCODONE-ACETAMINOPHEN 5-325 MG PO TABS: 1.0000 | ORAL_TABLET | Freq: Four times a day (QID) | ORAL | 0 refills | Status: AC | PRN

## 2020-03-18 NOTE — ED Notes (Signed)
Patient transported to CT 

## 2020-03-18 NOTE — ED Notes (Signed)
Patient verbalizes understanding of discharge instructions. Opportunity for questioning and answers were provided. Armband removed by staff, pt discharged from ED via wheelchair to lobby and went home via cab.

## 2020-03-18 NOTE — Consult Note (Signed)
REASON FOR CONSULT:    Abdominal aortic aneurysm.  The consult is requested by Dr. Bebe Shaggy  ASSESSMENT & PLAN:   3.6 CM INFRARENAL ABDOMINAL AORTIC ANEURYSM: This patient has a very small infrarenal abdominal aortic aneurysm.  I do not think that her symptoms are related to her small aneurysm.  The finding noted by the radiologist anterior to the aneurysm to me appears to be small bowel looking at the lateral projection.  I do not think that the patient has any evidence of a ruptured abdominal aortic aneurysm.  I will arrange a follow-up CT scan in 3 months.  We will premedicate her given her dye allergy.  She understands we would not consider elective repair unless the aneurysm became symptomatic or was 5.5 cm in maximum diameter.  We have discussed the importance of tobacco cessation.  Did obviously would be important to get her blood pressure under better control.  She now has a primary care physician and is scheduled to see them in April of this year.   Waverly Ferrari, MD Office: [phone]   HPI:   Abigail Ruiz is a pleasant 70 y.o. female, with a long history of a back pain.  She had a spinal fusion at L4-L5 and L5-S1 in the remote past.  She has had chronic low back pain.  However she also developed some right upper quadrant pain which comes and goes.  She describes an electric shock feeling intermittently.  There is no family history of aneurysmal disease.  She states that she has not been seeing a primary care physician but now is scheduled to see one in April.  Her blood pressure was elevated in the emergency department she has not been following this closely.  Risk factors for peripheral vascular disease include hypertension and tobacco use.  She smokes a half a pack per day is and has been smoking since she was 13.  She denies any history of diabetes, hypercholesterolemia, history of previous myocardial infarction or history congestive heart failure.  Past Medical History:   Diagnosis Date  . DES exposure in utero born [birth year]   Mother took DES  . Diabetes mellitus due to underlying condition with diabetic neuropathic arthropathy [facility]) age 13  . H/O renal calculi 1999  . Hyperlipidemia   . Hypertension    borderline  . Neuropathy   . Thyroid disease age 27   controlled on meds    Family History  Problem Relation Age of Onset  . ALS Mother   . Stroke Father   . Hypertension Father     SOCIAL HISTORY: Social History   Socioeconomic History  . Marital status: Single    Spouse name: Not on file  . Number of children: Not on file  . Years of education: Not on file  . Highest education level: Not on file  Occupational History  . Not on file  Tobacco Use  . Smoking status: Current Every Day Smoker  . Smokeless tobacco: Former Neurosurgeon    Quit date: 04/03/2012  Substance and Sexual Activity  . Alcohol use: No  . Drug use: No  . Sexual activity: Never    Birth control/protection: Post-menopausal  Other Topics Concern  . Not on file  Social History Narrative  . Not on file   Social Determinants of Health   Financial Resource Strain: Not on file  Food Insecurity: Not on file  Transportation Needs: Not on file  Physical Activity: Not on file  Stress: Not on file  Social Connections: Not on file  Intimate Partner Violence: Not on file    Allergies  Allergen Reactions  . Ivp Dye [Iodinated Diagnostic Agents] Anaphylaxis    No current facility-administered medications for this encounter.   Current Outpatient Medications  Medication Sig Dispense Refill  . HYDROcodone-acetaminophen (NORCO/VICODIN) 5-325 MG tablet Take 1 tablet by mouth every 6 (six) hours as needed. 10 tablet 0  . aspirin 500 MG tablet Take 500 mg by mouth every 6 (six) hours as needed for pain.    . ciprofloxacin (CIPRO) 500 MG tablet Take 1 tablet (500 mg total) by mouth 2 (two) times daily. Take twice a day for 7 days. 14 tablet 0  . gabapentin (NEURONTIN) 600 MG tablet  Take 600 mg by mouth 3 (three) times daily.    Marland Kitchen levothyroxine (SYNTHROID, LEVOTHROID) 100 MCG tablet Take 1 tablet (100 mcg total) by mouth daily before breakfast. 30 tablet 0  . metFORMIN (GLUMETZA) 500 MG (MOD) 24 hr tablet Take 1 tablet (500 mg total) by mouth 2 (two) times daily with a meal. 30 tablet 0  . Naproxen Sodium (ALEVE) 220 MG CAPS Take by mouth daily.    . nitrofurantoin, macrocrystal-monohydrate, (MACROBID) 100 MG capsule Take 1 capsule (100 mg total) by mouth 2 (two) times daily. 14 capsule 0  . simvastatin (ZOCOR) 20 MG tablet Take 1 tablet (20 mg total) by mouth every evening. 30 tablet 0    REVIEW OF SYSTEMS:  [X]  denotes positive finding, [ ]  denotes negative finding Cardiac  Comments:  Chest pain or chest pressure:    Shortness of breath upon exertion:    Short of breath when lying flat:    Irregular heart rhythm:        Vascular    Pain in calf, thigh, or hip brought on by ambulation:    Pain in feet at night that wakes you up from your sleep:     Blood clot in your veins:    Leg swelling:         Pulmonary    Oxygen at home:    Productive cough:     Wheezing:         Neurologic    Sudden weakness in arms or legs:     Sudden numbness in arms or legs:     Sudden onset of difficulty speaking or slurred speech:    Temporary loss of vision in one eye:     Problems with dizziness:         Gastrointestinal    Blood in stool:     Vomited blood:         Genitourinary    Burning when urinating:     Blood in urine:        Psychiatric    Major depression:         Hematologic    Bleeding problems:    Problems with blood clotting too easily:        Skin    Rashes or ulcers:        Constitutional    Fever or chills:     PHYSICAL EXAM:   Vitals:   03/17/20 2300 03/17/20 2330 03/18/20 0227 03/18/20 0503  BP: 129/70 (!) 148/85 137/69 (!) 162/75  Pulse: 96 91 81 73  Resp:  16 16 17   Temp:    97.8 F (36.6 C)  TempSrc:    Oral  SpO2: 98% 97% 97%  92%    GENERAL: The patient is a  well-nourished female, in no acute distress. The vital signs are documented above. CARDIAC: There is a regular rate and rhythm.  VASCULAR: I do not detect carotid bruits. She has palpable femoral pulses. I cannot palpate popliteal or pedal pulses.  Both feet are warm and well perfused. PULMONARY: There is good air exchange bilaterally without wheezing or rales. ABDOMEN: Soft and non-tender with normal pitched bowel sounds.  Her aneurysm is palpable but nontender. MUSCULOSKELETAL: There are no major deformities or cyanosis. NEUROLOGIC: No focal weakness or paresthesias are detected. SKIN: There are no ulcers or rashes noted. PSYCHIATRIC: The patient has a normal affect.  DATA:    CT ABDOMEN PELVIS: I reviewed the images of her CT abdomen pelvis which was done without contrast given that she has a dye allergy.  This shows a small 3.6 cm infrarenal abdominal aortic aneurysm.  There is poor visualization of the iliac arteries.  Radiology noted some tight soft tissue anterior to the aneurysm.  On my review of the films especially the lateral projection I think this is likely bowel.  I do not see any evidence of a leak or ruptured abdominal aortic aneurysm.  LABS: Her renal function is normal.

## 2020-03-18 NOTE — ED Notes (Signed)
Pt still in mri 

## 2020-03-18 NOTE — ED Notes (Signed)
Pt expressed need to urinate, ambulated pt to bathroom, pt did not void. Pt vomited on clothes and sheets. Put pt in new gown and put new sheets on bed. Md notified of pts vomiting.

## 2020-03-18 NOTE — ED Provider Notes (Signed)
CT imaging discussed with Dr. Edilia Bo. He will evaluate imaging & talk to the patient   Zadie Rhine, MD 03/18/20 [phone]

## 2020-03-18 NOTE — ED Provider Notes (Signed)
Discussed MRI findings with Dr. Phill Myron with radiology.  There is no signs of any acute spinal emergency.  However it does reveal a AAA and potentially periaortic irregularity.  He recommends CT angio abdomen pelvis.  Unfortunately patient has an IV dye allergy.  He then recommended CT abd/pelvis without contrast.  On my exam patient is awake alert with focal right upper quadrant tenderness. She is in no acute distress. Plan to obtain CT abdomen/pelvis and if negative can be discharged home   Zadie Rhine, MD 03/18/20 [phone]

## 2020-03-18 NOTE — ED Provider Notes (Signed)
Seen by Dr. Edilia Bo.  Appreciate his input and he will help arrange follow-up as an outpatient.  He does not feel patient needs emergent management at this time, but outpatient CT scan in 3 months Patient overall appropriate for discharge home   Zadie Rhine, MD 03/18/20 [phone]

## 2020-03-18 NOTE — ED Notes (Signed)
Patient transported to MRI 

## 2020-03-18 NOTE — ED Notes (Signed)
Informed pt a urine specimen is needed. Pt denies having to urinate. Pt provided with water.

## 2020-03-18 NOTE — ED Provider Notes (Signed)
Patient found to have UTI.  She is otherwise well-appearing in no acute distress.  She is nonseptic appearing.  Will place on Keflex 3 times daily.  We discussed return precautions   Zadie Rhine, MD 03/18/20 [phone]

## 2020-04-29 ENCOUNTER — Other Ambulatory Visit: Payer: Self-pay

## 2020-04-30 ENCOUNTER — Encounter: Payer: Self-pay | Admitting: Nurse Practitioner

## 2020-04-30 ENCOUNTER — Ambulatory Visit (INDEPENDENT_AMBULATORY_CARE_PROVIDER_SITE_OTHER): Payer: Medicare Other | Admitting: Nurse Practitioner

## 2020-04-30 VITALS — BP 176/100 | HR 92 | Temp 97.4°F | Ht 64.5 in | Wt 142.0 lb

## 2020-04-30 DIAGNOSIS — I714 Abdominal aortic aneurysm, without rupture, unspecified: Secondary | ICD-10-CM | POA: Insufficient documentation

## 2020-04-30 DIAGNOSIS — I700 Atherosclerosis of aorta: Secondary | ICD-10-CM | POA: Diagnosis not present

## 2020-04-30 DIAGNOSIS — E876 Hypokalemia: Secondary | ICD-10-CM | POA: Insufficient documentation

## 2020-04-30 DIAGNOSIS — E038 Other specified hypothyroidism: Secondary | ICD-10-CM

## 2020-04-30 DIAGNOSIS — E039 Hypothyroidism, unspecified: Secondary | ICD-10-CM | POA: Diagnosis not present

## 2020-04-30 DIAGNOSIS — M545 Low back pain, unspecified: Secondary | ICD-10-CM | POA: Diagnosis not present

## 2020-04-30 DIAGNOSIS — I10 Essential (primary) hypertension: Secondary | ICD-10-CM

## 2020-04-30 DIAGNOSIS — G8929 Other chronic pain: Secondary | ICD-10-CM | POA: Insufficient documentation

## 2020-04-30 DIAGNOSIS — E7800 Pure hypercholesterolemia, unspecified: Secondary | ICD-10-CM | POA: Diagnosis not present

## 2020-04-30 DIAGNOSIS — M546 Pain in thoracic spine: Secondary | ICD-10-CM

## 2020-04-30 DIAGNOSIS — N3000 Acute cystitis without hematuria: Secondary | ICD-10-CM

## 2020-04-30 DIAGNOSIS — R739 Hyperglycemia, unspecified: Secondary | ICD-10-CM | POA: Diagnosis not present

## 2020-04-30 LAB — BASIC METABOLIC PANEL
BUN: 25 mg/dL — ABNORMAL HIGH (ref 6–23)
CO2: 28 mEq/L (ref 19–32)
Calcium: 10 mg/dL (ref 8.4–10.5)
Chloride: 104 mEq/L (ref 96–112)
Creatinine, Ser: 1.16 mg/dL (ref 0.40–1.20)
GFR: 47.21 mL/min — ABNORMAL LOW (ref >60.00)
Glucose, Bld: 123 mg/dL — ABNORMAL HIGH (ref 70–99)
Potassium: 5.2 mEq/L — ABNORMAL HIGH (ref 3.5–5.1)
Sodium: 141 mEq/L (ref 135–145)

## 2020-04-30 LAB — HEPATIC FUNCTION PANEL
ALT: 5 U/L (ref 0–35)
AST: 12 U/L (ref 0–37)
Albumin: 4.5 g/dL (ref 3.5–5.2)
Alkaline Phosphatase: 75 U/L (ref 39–117)
Bilirubin, Direct: 0.1 mg/dL (ref 0.0–0.3)
Total Bilirubin: 0.3 mg/dL (ref 0.2–1.2)
Total Protein: 7.5 g/dL (ref 6.0–8.3)

## 2020-04-30 LAB — LIPID PANEL
Cholesterol: 266 mg/dL — ABNORMAL HIGH (ref 0–200)
HDL: 52.2 mg/dL (ref >39.00)
LDL Cholesterol: 184 mg/dL — ABNORMAL HIGH (ref 0–99)
NonHDL: 214.05
Total CHOL/HDL Ratio: 5
Triglycerides: 148 mg/dL (ref 0.0–149.0)
VLDL: 29.6 mg/dL (ref 0.0–40.0)

## 2020-04-30 LAB — TSH: TSH: 139.5 u[IU]/mL — ABNORMAL HIGH (ref 0.35–4.50)

## 2020-04-30 LAB — HEMOGLOBIN A1C: Hgb A1c MFr Bld: 6.6 % — ABNORMAL HIGH (ref 4.6–6.5)

## 2020-04-30 LAB — T4, FREE: Free T4: 0.25 ng/dL — ABNORMAL LOW (ref 0.60–1.60)

## 2020-04-30 MED ORDER — SIMVASTATIN 20 MG PO TABS: 20.0000 mg | ORAL_TABLET | Freq: Every evening | ORAL | 5 refills | Status: AC

## 2020-04-30 MED ORDER — LEVOTHYROXINE SODIUM 200 MCG PO TABS
200.0000 ug | ORAL_TABLET | Freq: Every day | ORAL | 1 refills | Status: DC
Start: 2020-04-30 — End: 2020-06-04

## 2020-04-30 MED ORDER — VALSARTAN 160 MG PO TABS: 160.0000 mg | ORAL_TABLET | Freq: Every day | ORAL | 5 refills | Status: DC

## 2020-04-30 MED ORDER — MELOXICAM 7.5 MG PO TABS: 7.5000 mg | ORAL_TABLET | Freq: Every day | ORAL | 2 refills | Status: DC

## 2020-04-30 MED ORDER — PREGABALIN 25 MG PO CAPS: ORAL_CAPSULE | ORAL | 2 refills | Status: DC

## 2020-04-30 NOTE — Assessment & Plan Note (Signed)
MRI lumbar spine 03/2020: No other acute abnormality within the lumbar spine. Sequelae of prior posterior fusion and decompression at L3-4 through L5-S1 without residual or recurrent stenosis. Adjacent segment disease with disc bulge and facet hypertrophy at L2-3 with resultant moderate canal and left worse than right lateral recess stenosis. Broad right extraforaminal disc protrusion at L1-2 without significant stenosis.  Unable to tolerate gabapentin (muscle cramps) Start mobic and lyrica

## 2020-04-30 NOTE — Assessment & Plan Note (Signed)
Uncontrolled, asymptomatic States she stopped tobacco use 30month ago BP Readings from Last 3 Encounters:  04/30/20 (!) 176/100  03/18/20 (!) 156/71  04/23/12 136/80   Start valsartan 160mg  Reviewed lab results from recent ED visit. F/up in 2weeks

## 2020-04-30 NOTE — Progress Notes (Signed)
Subjective:  Patient ID: Abigail Ruiz, female    DOB: 1950/01/10  Age: 70 y.o. MRN: [id number]  CC: Establish Care [facility] f/u/Pt states she was in the hospital due to abdominal pain and back numbness./Pt has upcoming appointment with Cardiologist.)  HPI Single, lives alone, estrange from her children, does not drive, she has connected with social worker to help with community resources like reliable transportation.  Chronic bilateral thoracic back pain Chronic mid and lower back pain with skin hypersensitivity and radiculopathy to ABD RUQ. No improvement with aspirin. Unable to tolerate gabapentin in past (muscle cramps) CT ABD/pelvis 03/2020: Extensive multilevel thoracolumbar spondylosis, with postsurgical changes from lumbar fusion spanning L3 through S1. MRI thoracic spine 03/2020: mild disc bulging with disc desiccation, no stenosis, foramen intact.  Start meloxicam and lyrica 25mg  (titrate to 1cap BID) F/up in 2weeks    Primary hypertension Uncontrolled, asymptomatic States she stopped tobacco use 69month ago BP Readings from Last 3 Encounters:  04/30/20 (!) 176/100  03/18/20 (!) 156/71  04/23/12 136/80   Start valsartan 160mg  Reviewed lab results from recent ED visit. F/up in 2weeks  Chronic lower back pain MRI lumbar spine 03/2020: No other acute abnormality within the lumbar spine. Sequelae of prior posterior fusion and decompression at L3-4 through L5-S1 without residual or recurrent stenosis. Adjacent segment disease with disc bulge and facet hypertrophy at L2-3 with resultant moderate canal and left worse than right lateral recess stenosis. Broad right extraforaminal disc protrusion at L1-2 without significant stenosis.  Unable to tolerate gabapentin (muscle cramps) Start mobic and lyrica  AAA (abdominal aortic aneurysm) (HCC) CT ABD 03/2020: Infrarenal abdominal aortic aneurysm measuring up to 3.6 cm. Crescentic soft tissue along the anterior aspect of the  infrarenal aorta corresponds to MRI findings, may reflect intramural hematoma of uncertain chronicity. No gross evidence of leak or rupture, though evaluation of the aorta is limited by metallic streak artifact from lumbar fusion hardware as well as the lack of intravenous contrast.  Upcoming appt with Cardiology Elevated BP: start valsartan. Reports she has stopped tobacco since ED visit.   Reviewed past Medical, Social and Family history today.  Outpatient Medications Prior to Visit  Medication Sig Dispense Refill  . HYDROcodone-acetaminophen (NORCO/VICODIN) 5-325 MG tablet Take 1 tablet by mouth every 6 (six) hours as needed. 10 tablet 0  . metFORMIN (GLUMETZA) 500 MG (MOD) 24 hr tablet Take 1 tablet (500 mg total) by mouth 2 (two) times daily with a meal. 30 tablet 0  . aspirin 500 MG tablet Take 500 mg by mouth every 6 (six) hours as needed for pain.    04/2020 gabapentin (NEURONTIN) 600 MG tablet Take 600 mg by mouth 3 (three) times daily.    04/2020 levothyroxine (SYNTHROID, LEVOTHROID) 100 MCG tablet Take 1 tablet (100 mcg total) by mouth daily before breakfast. 30 tablet 0  . Naproxen Sodium 220 MG CAPS Take by mouth daily.    . simvastatin (ZOCOR) 20 MG tablet Take 1 tablet (20 mg total) by mouth every evening. 30 tablet 0  . cephALEXin (KEFLEX) 500 MG capsule Take 1 capsule (500 mg total) by mouth 3 (three) times daily. (Patient not taking: Reported on 04/30/2020) 21 capsule 0   No facility-administered medications prior to visit.    ROS See HPI  Objective:  BP (!) 176/100 (BP Location: Left Arm, Patient Position: Sitting, Cuff Size: Normal)   Pulse 92   Temp (!) 97.4 F (36.3 C) (Temporal)   Ht 5' 4.5" (1.638 m)  Wt 142 lb (64.4 kg)   LMP 04/24/2003   SpO2 98%   BMI 24.00 kg/m   Physical Exam Constitutional:      General: She is not in acute distress. Cardiovascular:     Rate and Rhythm: Normal rate and regular rhythm.     Pulses: Normal pulses.     Heart sounds:  Normal heart sounds.  Pulmonary:     Effort: Pulmonary effort is normal.     Breath sounds: Normal breath sounds.  Musculoskeletal:        General: Tenderness present.     Right lower leg: No edema.     Left lower leg: No edema.  Neurological:     Mental Status: She is alert and oriented to person, place, and time.    Assessment & Plan:  This visit occurred during the SARS-CoV-2 public health emergency.  Safety protocols were in place, including screening questions prior to the visit, additional usage of staff PPE, and extensive cleaning of exam room while observing appropriate contact time as indicated for disinfecting solutions.   Abigail Ruiz was seen today for establish care.  Diagnoses and all orders for this visit:  Primary hypertension -     valsartan (DIOVAN) 160 MG tablet; Take 1 tablet (160 mg total) by mouth daily.  Pure hypercholesterolemia -     simvastatin (ZOCOR) 20 MG tablet; Take 1 tablet (20 mg total) by mouth every evening. -     Lipid panel -     Hepatic function panel  Hyperglycemia -     Hemoglobin A1c  Aortic atherosclerosis (HCC) -     Lipid panel  Hypothyroidism, unspecified type -     TSH -     T4, free  Hypokalemia -     Basic metabolic panel  Acute cystitis without hematuria -     Urinalysis w microscopic + reflex cultur  Chronic bilateral thoracic back pain -     meloxicam (MOBIC) 7.5 MG tablet; Take 1 tablet (7.5 mg total) by mouth daily. With food -     pregabalin (LYRICA) 25 MG capsule; 1cap at bedtime x 1week, then 1cap BID continuously  Chronic bilateral low back pain without sciatica  Abdominal aortic aneurysm (AAA) without rupture (HCC)  Hypothyroidism -     levothyroxine (SYNTHROID) 200 MCG tablet; Take 1 tablet (200 mcg total) by mouth daily before breakfast. before meal    Problem List Items Addressed This Visit      Cardiovascular and Mediastinum   AAA (abdominal aortic aneurysm) (HCC)    CT ABD 03/2020: Infrarenal  abdominal aortic aneurysm measuring up to 3.6 cm. Crescentic soft tissue along the anterior aspect of the infrarenal aorta corresponds to MRI findings, may reflect intramural hematoma of uncertain chronicity. No gross evidence of leak or rupture, though evaluation of the aorta is limited by metallic streak artifact from lumbar fusion hardware as well as the lack of intravenous contrast.  Upcoming appt with Cardiology Elevated BP: start valsartan. Reports she has stopped tobacco since ED visit.      Relevant Medications   simvastatin (ZOCOR) 20 MG tablet   valsartan (DIOVAN) 160 MG tablet   Aortic atherosclerosis (HCC)   Relevant Medications   simvastatin (ZOCOR) 20 MG tablet   valsartan (DIOVAN) 160 MG tablet   Other Relevant Orders   Lipid panel (Completed)   Primary hypertension - Primary    Uncontrolled, asymptomatic States she stopped tobacco use 85month ago BP Readings from Last 3 Encounters:  04/30/20 Marland Kitchen)  176/100  03/18/20 (!) 156/71  04/23/12 136/80   Start valsartan 160mg  Reviewed lab results from recent ED visit. F/up in 2weeks      Relevant Medications   simvastatin (ZOCOR) 20 MG tablet   valsartan (DIOVAN) 160 MG tablet     Endocrine   Hypothyroidism   Relevant Medications   levothyroxine (SYNTHROID) 200 MCG tablet   Other Relevant Orders   TSH (Completed)   T4, free (Completed)     Other   Chronic bilateral thoracic back pain    Chronic mid and lower back pain with skin hypersensitivity and radiculopathy to ABD RUQ. No improvement with aspirin. Unable to tolerate gabapentin in past (muscle cramps) CT ABD/pelvis 03/2020: Extensive multilevel thoracolumbar spondylosis, with postsurgical changes from lumbar fusion spanning L3 through S1. MRI thoracic spine 03/2020: mild disc bulging with disc desiccation, no stenosis, foramen intact.  Start meloxicam and lyrica 25mg  (titrate to 1cap BID) F/up in 2weeks        Relevant Medications   meloxicam  (MOBIC) 7.5 MG tablet   pregabalin (LYRICA) 25 MG capsule   Chronic lower back pain    MRI lumbar spine 03/2020: No other acute abnormality within the lumbar spine. Sequelae of prior posterior fusion and decompression at L3-4 through L5-S1 without residual or recurrent stenosis. Adjacent segment disease with disc bulge and facet hypertrophy at L2-3 with resultant moderate canal and left worse than right lateral recess stenosis. Broad right extraforaminal disc protrusion at L1-2 without significant stenosis.  Unable to tolerate gabapentin (muscle cramps) Start mobic and lyrica      Relevant Medications   meloxicam (MOBIC) 7.5 MG tablet   Hyperglycemia   Relevant Orders   Hemoglobin A1c (Completed)   Hypokalemia   Relevant Orders   Basic metabolic panel (Completed)   Pure hypercholesterolemia   Relevant Medications   simvastatin (ZOCOR) 20 MG tablet   valsartan (DIOVAN) 160 MG tablet   Other Relevant Orders   Lipid panel (Completed)   Hepatic function panel (Completed)    Other Visit Diagnoses    Acute cystitis without hematuria       Relevant Orders   Urinalysis w microscopic + reflex cultur      I have spent with this patient regarding history taking, documentation, review of ED reports (labs, radiology, specialty note), formulating plan and discussing treatment options with patient.  Follow-up: Return in about 2 weeks (around 05/14/2020) for HTN and back pain ( ).  07/14/2020, NP

## 2020-04-30 NOTE — Patient Instructions (Addendum)
Thank you for choosing [facility]  Abnormal TSH and T4: resume levothyroxine. New rx sent HgbA1c of 6.5: no metformin at this time BMP indicates need to increase oral hydration (60oz of water per day) Will repeat labs in 2weeks.  Start valsartan 160mg  once a day for elevated BP. Maintain DASH diet  Stop all OTC pain medications Start meloxicam and lyrica as prescribed for back pain  Resume simvastatin for cholesterol.  Maintain appt with cardiology.  DASH Eating Plan DASH stands for Dietary Approaches to Stop Hypertension. The DASH eating plan is a healthy eating plan that has been shown to:  Reduce high blood pressure (hypertension).  Reduce your risk for type 2 diabetes, heart disease, and stroke.  Help with weight loss. What are tips for following this plan? Reading food labels  Check food labels for the amount of salt (sodium) per serving. Choose foods with less than 5 percent of the Daily Value of sodium. Generally, foods with less than 300 milligrams (mg) of sodium per serving fit into this eating plan.  To find whole grains, look for the word "whole" as the first word in the ingredient list. Shopping  Buy products labeled as "low-sodium" or "no salt added."  Buy fresh foods. Avoid canned foods and pre-made or frozen meals. Cooking  Avoid adding salt when cooking. Use salt-free seasonings or herbs instead of table salt or sea salt. Check with your health [facility] provider or pharmacist before using salt substitutes.  Do not fry foods. Cook foods using healthy methods such as baking, boiling, grilling, roasting, and broiling instead.  Cook with heart-healthy oils, such as olive, canola, avocado, soybean, or sunflower oil. Meal planning  Eat a balanced diet that includes: ? 4 or more servings of fruits and 4 or more servings of vegetables each day. Try to fill one-half of your plate with fruits and vegetables. ? 6-8 servings of whole grains each day. ? Less  than 6 oz (170 g) of lean meat, poultry, or fish each day. A 3-oz (85-g) serving of meat is about the same size as a deck of cards. One egg equals 1 oz (28 g). ? 2-3 servings of low-fat dairy each day. One serving is 1 cup (237 mL). ? 1 serving of nuts, seeds, or beans 5 times each week. ? 2-3 servings of heart-healthy fats. Healthy fats called omega-3 fatty acids are found in foods such as walnuts, flaxseeds, fortified milks, and eggs. These fats are also found in cold-water fish, such as sardines, salmon, and mackerel.  Limit how much you eat of: ? Canned or prepackaged foods. ? Food that is high in trans fat, such as some fried foods. ? Food that is high in saturated fat, such as fatty meat. ? Desserts and other sweets, sugary drinks, and other foods with added sugar. ? Full-fat dairy products.  Do not salt foods before eating.  Do not eat more than 4 egg yolks a week.  Try to eat at least 2 vegetarian meals a week.  Eat more home-cooked food and less restaurant, buffet, and fast food.   Lifestyle  When eating at a restaurant, ask that your food be prepared with less salt or no salt, if possible.  If you drink alcohol: ? Limit how much you use to:  0-1 drink a day for women who are not pregnant.  0-2 drinks a day for men. ? Be aware of how much alcohol is in your drink. In the [place] one drink equals one  12 oz bottle of beer (355 mL), one 5 oz glass of wine (148 mL), or one 1 oz glass of hard liquor (44 mL). General information  Avoid eating more than 2,300 mg of salt a day. If you have hypertension, you may need to reduce your sodium intake to 1,500 mg a day.  Work with your health [facility] provider to maintain a healthy body weight or to lose weight. Ask what an ideal weight is for you.  Get at least 30 minutes of exercise that causes your heart to beat faster (aerobic exercise) most days of the week. Activities may include walking, swimming, or biking.  Work with your health  [facility] provider or dietitian to adjust your eating plan to your individual calorie needs. What foods should I eat? Fruits All fresh, dried, or frozen fruit. Canned fruit in natural juice (without added sugar). Vegetables Fresh or frozen vegetables (raw, steamed, roasted, or grilled). Low-sodium or reduced-sodium tomato and vegetable juice. Low-sodium or reduced-sodium tomato sauce and tomato paste. Low-sodium or reduced-sodium canned vegetables. Grains Whole-grain or whole-wheat bread. Whole-grain or whole-wheat pasta. Brown rice. Orpah Cobb. Bulgur. Whole-grain and low-sodium cereals. Pita bread. Low-fat, low-sodium crackers. Whole-wheat flour tortillas. Meats and other proteins Skinless chicken or [place]. Ground chicken or [place]. Pork with fat trimmed off. Fish and seafood. Egg whites. Dried beans, peas, or lentils. Unsalted nuts, nut butters, and seeds. Unsalted canned beans. Lean cuts of beef with fat trimmed off. Low-sodium, lean precooked or cured meat, such as sausages or meat loaves. Dairy Low-fat (1%) or fat-free (skim) milk. Reduced-fat, low-fat, or fat-free cheeses. Nonfat, low-sodium ricotta or cottage cheese. Low-fat or nonfat yogurt. Low-fat, low-sodium cheese. Fats and oils Soft margarine without trans fats. Vegetable oil. Reduced-fat, low-fat, or light mayonnaise and salad dressings (reduced-sodium). Canola, safflower, olive, avocado, soybean, and sunflower oils. Avocado. Seasonings and condiments Herbs. Spices. Seasoning mixes without salt. Other foods Unsalted popcorn and pretzels. Fat-free sweets. The items listed above may not be a complete list of foods and beverages you can eat. Contact a dietitian for more information. What foods should I avoid? Fruits Canned fruit in a light or heavy syrup. Fried fruit. Fruit in cream or butter sauce. Vegetables Creamed or fried vegetables. Vegetables in a cheese sauce. Regular canned vegetables (not low-sodium or reduced-sodium).  Regular canned tomato sauce and paste (not low-sodium or reduced-sodium). Regular tomato and vegetable juice (not low-sodium or reduced-sodium). Rosita Fire. Olives. Grains Baked goods made with fat, such as croissants, muffins, or some breads. Dry pasta or rice meal packs. Meats and other proteins Fatty cuts of meat. Ribs. Fried meat. Tomasa Blase. Bologna, salami, and other precooked or cured meats, such as sausages or meat loaves. Fat from the back of a pig (fatback). Bratwurst. Salted nuts and seeds. Canned beans with added salt. Canned or smoked fish. Whole eggs or egg yolks. Chicken or [place] with skin. Dairy Whole or 2% milk, cream, and half-and-half. Whole or full-fat cream cheese. Whole-fat or sweetened yogurt. Full-fat cheese. Nondairy creamers. Whipped toppings. Processed cheese and cheese spreads. Fats and oils Butter. Stick margarine. Lard. Shortening. Ghee. Bacon fat. Tropical oils, such as coconut, palm kernel, or palm oil. Seasonings and condiments Onion salt, garlic salt, seasoned salt, table salt, and sea salt. Worcestershire sauce. Tartar sauce. Barbecue sauce. Teriyaki sauce. Soy sauce, including reduced-sodium. Steak sauce. Canned and packaged gravies. Fish sauce. Oyster sauce. Cocktail sauce. Store-bought horseradish. Ketchup. Mustard. Meat flavorings and tenderizers. Bouillon cubes. Hot sauces. Pre-made or packaged marinades. Pre-made or packaged taco seasonings.  Relishes. Regular salad dressings. Other foods Salted popcorn and pretzels. The items listed above may not be a complete list of foods and beverages you should avoid. Contact a dietitian for more information. Where to find more information  National Heart, Lung, and Blood Institute: PopSteam.is  American Heart Association: www.heart.org  Academy of Nutrition and Dietetics: www.eatright.org  National Kidney Foundation: www.kidney.org Summary  The DASH eating plan is a healthy eating plan that has been shown to  reduce high blood pressure (hypertension). It may also reduce your risk for type 2 diabetes, heart disease, and stroke.  When on the DASH eating plan, aim to eat more fresh fruits and vegetables, whole grains, lean proteins, low-fat dairy, and heart-healthy fats.  With the DASH eating plan, you should limit salt (sodium) intake to 2,300 mg a day. If you have hypertension, you may need to reduce your sodium intake to 1,500 mg a day.  Work with your health [facility] provider or dietitian to adjust your eating plan to your individual calorie needs. This information is not intended to replace advice given to you by your health [facility] provider. Make sure you discuss any questions you have with your health [facility] provider. Document Revised: 11/23/2018 Document Reviewed: 11/23/2018 Elsevier Patient Education  2021 [employer].

## 2020-04-30 NOTE — Assessment & Plan Note (Addendum)
Chronic mid and lower back pain with skin hypersensitivity and radiculopathy to ABD RUQ. No improvement with aspirin. Unable to tolerate gabapentin in past (muscle cramps) CT ABD/pelvis 03/2020: Extensive multilevel thoracolumbar spondylosis, with postsurgical changes from lumbar fusion spanning L3 through S1. MRI thoracic spine 03/2020: mild disc bulging with disc desiccation, no stenosis, foramen intact.  Start meloxicam and lyrica 25mg  (titrate to 1cap BID) F/up in 2weeks

## 2020-04-30 NOTE — Assessment & Plan Note (Signed)
CT ABD 03/2020: Infrarenal abdominal aortic aneurysm measuring up to 3.6 cm. Crescentic soft tissue along the anterior aspect of the infrarenal aorta corresponds to MRI findings, may reflect intramural hematoma of uncertain chronicity. No gross evidence of leak or rupture, though evaluation of the aorta is limited by metallic streak artifact from lumbar fusion hardware as well as the lack of intravenous contrast.  Upcoming appt with Cardiology Elevated BP: start valsartan. Reports she has stopped tobacco since ED visit.

## 2020-05-02 LAB — URINALYSIS W MICROSCOPIC + REFLEX CULTURE
Bacteria, UA: NONE SEEN /HPF
Bilirubin Urine: NEGATIVE
Glucose, UA: NEGATIVE
Hgb urine dipstick: NEGATIVE
Ketones, ur: NEGATIVE
Nitrites, Initial: NEGATIVE
Protein, ur: NEGATIVE
RBC / HPF: NONE SEEN /HPF (ref 0–2)
Specific Gravity, Urine: 1.016 (ref 1.001–1.035)
Yeast: NONE SEEN /HPF
pH: <=5 (ref 5.0–8.0)

## 2020-05-02 LAB — URINE CULTURE
MICRO NUMBER:: 11829357
SPECIMEN QUALITY:: ADEQUATE

## 2020-05-02 LAB — CULTURE INDICATED

## 2020-05-14 ENCOUNTER — Other Ambulatory Visit: Payer: Self-pay

## 2020-05-15 ENCOUNTER — Ambulatory Visit: Payer: Medicare Other | Admitting: Nurse Practitioner

## 2020-05-20 ENCOUNTER — Other Ambulatory Visit: Payer: Self-pay

## 2020-05-20 DIAGNOSIS — I714 Abdominal aortic aneurysm, without rupture, unspecified: Secondary | ICD-10-CM

## 2020-05-20 DIAGNOSIS — G8929 Other chronic pain: Secondary | ICD-10-CM

## 2020-05-20 DIAGNOSIS — I700 Atherosclerosis of aorta: Secondary | ICD-10-CM

## 2020-06-03 ENCOUNTER — Telehealth: Payer: Self-pay

## 2020-06-03 ENCOUNTER — Ambulatory Visit (INDEPENDENT_AMBULATORY_CARE_PROVIDER_SITE_OTHER): Payer: Medicare Other | Admitting: Nurse Practitioner

## 2020-06-03 ENCOUNTER — Other Ambulatory Visit: Payer: Self-pay

## 2020-06-03 ENCOUNTER — Encounter: Payer: Self-pay | Admitting: Nurse Practitioner

## 2020-06-03 VITALS — BP 140/70 | HR 70 | Temp 97.4°F | Ht 64.5 in | Wt 137.2 lb

## 2020-06-03 DIAGNOSIS — G8929 Other chronic pain: Secondary | ICD-10-CM | POA: Diagnosis not present

## 2020-06-03 DIAGNOSIS — I10 Essential (primary) hypertension: Secondary | ICD-10-CM | POA: Diagnosis not present

## 2020-06-03 DIAGNOSIS — N289 Disorder of kidney and ureter, unspecified: Secondary | ICD-10-CM | POA: Diagnosis not present

## 2020-06-03 DIAGNOSIS — M546 Pain in thoracic spine: Secondary | ICD-10-CM

## 2020-06-03 DIAGNOSIS — E038 Other specified hypothyroidism: Secondary | ICD-10-CM | POA: Diagnosis not present

## 2020-06-03 DIAGNOSIS — R8290 Unspecified abnormal findings in urine: Secondary | ICD-10-CM

## 2020-06-03 DIAGNOSIS — E7800 Pure hypercholesterolemia, unspecified: Secondary | ICD-10-CM

## 2020-06-03 DIAGNOSIS — M545 Low back pain, unspecified: Secondary | ICD-10-CM

## 2020-06-03 LAB — POCT URINALYSIS DIPSTICK
Bilirubin, UA: NEGATIVE
Blood, UA: NEGATIVE
Glucose, UA: NEGATIVE
Ketones, UA: NEGATIVE
Nitrite, UA: NEGATIVE
Protein, UA: POSITIVE — AB
Spec Grav, UA: 1.015 (ref 1.010–1.025)
Urobilinogen, UA: NEGATIVE E.U./dL — AB
pH, UA: 6 (ref 5.0–8.0)

## 2020-06-03 LAB — TSH: TSH: 0.29 u[IU]/mL — ABNORMAL LOW (ref 0.35–4.50)

## 2020-06-03 LAB — T4, FREE: Free T4: 1.58 ng/dL (ref 0.60–1.60)

## 2020-06-03 MED ORDER — MELOXICAM 7.5 MG PO TABS: 7.5000 mg | ORAL_TABLET | Freq: Every day | ORAL | 1 refills | Status: AC

## 2020-06-03 MED ORDER — VALSARTAN 160 MG PO TABS: 160.0000 mg | ORAL_TABLET | Freq: Every day | ORAL | 1 refills | Status: AC

## 2020-06-03 MED ORDER — PREGABALIN 25 MG PO CAPS
25.0000 mg | ORAL_CAPSULE | Freq: Two times a day (BID) | ORAL | 3 refills | Status: AC
Start: 2020-06-03 — End: ?

## 2020-06-03 NOTE — Progress Notes (Signed)
Subjective:  Patient ID: Abigail Ruiz, female    DOB: [birth year]  Age: 70 y.o. MRN: [id number]  CC: Follow-up (2 week f/u on HTN and back pain. Pt states she is still having back pain. )  HPI  Primary hypertension Improving BP with valsartan and diet modification. BP Readings from Last 3 Encounters:  06/03/20 140/70  04/30/20 (!) 176/100  03/18/20 (!) 156/71   Repeat BMP Maintain current medication dose  Hypothyroidism States she is compliant with levothyroxine Repeat TSH and T4  Chronic bilateral thoracic back pain Improved with lyrica and mobic Refill sent  Chronic lower back pain Improved with lyrica and mobic Refill sent  Wt Readings from Last 3 Encounters:  06/03/20 137 lb 3.2 oz (62.2 kg)  04/30/20 142 lb (64.4 kg)  04/23/12 178 lb 3.2 oz (80.8 kg)   Reviewed past Medical, Social and Family history today.  Outpatient Medications Prior to Visit  Medication Sig Dispense Refill  . HYDROcodone-acetaminophen (NORCO/VICODIN) 5-325 MG tablet Take 1 tablet by mouth every 6 (six) hours as needed. 10 tablet 0  . levothyroxine (SYNTHROID) 200 MCG tablet Take 1 tablet (200 mcg total) by mouth daily before breakfast. before meal 90 tablet 1  . metFORMIN (GLUMETZA) 500 MG (MOD) 24 hr tablet Take 1 tablet (500 mg total) by mouth 2 (two) times daily with a meal. 30 tablet 0  . simvastatin (ZOCOR) 20 MG tablet Take 1 tablet (20 mg total) by mouth every evening. 30 tablet 5  . meloxicam (MOBIC) 7.5 MG tablet Take 1 tablet (7.5 mg total) by mouth daily. With food 30 tablet 2  . pregabalin (LYRICA) 25 MG capsule 1cap at bedtime x 1week, then 1cap BID continuously 60 capsule 2  . valsartan (DIOVAN) 160 MG tablet Take 1 tablet (160 mg total) by mouth daily. 30 tablet 5   No facility-administered medications prior to visit.    ROS See HPI  Objective:  BP 140/70 (BP Location: Left Arm, Patient Position: Sitting, Cuff Size: Normal)   Pulse 70   Temp (!) 97.4 F (36.3 C)  (Temporal)   Ht 5' 4.5" (1.638 m)   Wt 137 lb 3.2 oz (62.2 kg)   LMP 04/24/2003   SpO2 96%   BMI 23.19 kg/m   Physical Exam Vitals reviewed.  Cardiovascular:     Rate and Rhythm: Normal rate and regular rhythm.     Pulses: Normal pulses.     Heart sounds: Normal heart sounds.  Pulmonary:     Effort: Pulmonary effort is normal.     Breath sounds: Normal breath sounds.  Musculoskeletal:     Right lower leg: No edema.     Left lower leg: No edema.  Neurological:     Mental Status: She is alert and oriented to person, place, and time.  Psychiatric:        Mood and Affect: Mood normal.        Behavior: Behavior normal.        Thought Content: Thought content normal.    Assessment & Plan:  This visit occurred during the SARS-CoV-2 public health emergency.  Safety protocols were in place, including screening questions prior to the visit, additional usage of staff PPE, and extensive cleaning of exam room while observing appropriate contact time as indicated for disinfecting solutions.   Kemonie was seen today for follow-up.  Diagnoses and all orders for this visit:  Chronic bilateral low back pain without sciatica  Primary hypertension -  valsartan (DIOVAN) 160 MG tablet; Take 1 tablet (160 mg total) by mouth daily. -     Basic metabolic panel  Chronic bilateral thoracic back pain -     pregabalin (LYRICA) 25 MG capsule; Take 1 capsule (25 mg total) by mouth 2 (two) times daily. -     meloxicam (MOBIC) 7.5 MG tablet; Take 1 tablet (7.5 mg total) by mouth daily. With food  Other specified hypothyroidism -     TSH -     T4, free  Pure hypercholesterolemia -     CK (Creatine Kinase)  Abnormal urinalysis -     POCT urinalysis dipstick -     Urine Culture    Problem List Items Addressed This Visit      Cardiovascular and Mediastinum   Primary hypertension    Improving BP with valsartan and diet modification. BP Readings from Last 3 Encounters:  06/03/20 140/70   04/30/20 (!) 176/100  03/18/20 (!) 156/71   Repeat BMP Maintain current medication dose      Relevant Medications   valsartan (DIOVAN) 160 MG tablet   Other Relevant Orders   Basic metabolic panel     Endocrine   Hypothyroidism    States she is compliant with levothyroxine Repeat TSH and T4      Relevant Orders   TSH   T4, free     Other   Chronic bilateral thoracic back pain    Improved with lyrica and mobic Refill sent      Relevant Medications   pregabalin (LYRICA) 25 MG capsule   meloxicam (MOBIC) 7.5 MG tablet   Chronic lower back pain - Primary    Improved with lyrica and mobic Refill sent      Relevant Medications   meloxicam (MOBIC) 7.5 MG tablet   Pure hypercholesterolemia   Relevant Medications   valsartan (DIOVAN) 160 MG tablet   Other Relevant Orders   CK (Creatine Kinase)    Other Visit Diagnoses    Abnormal urinalysis       Relevant Orders   POCT urinalysis dipstick   Urine Culture      Follow-up: Return in about 3 months (around 09/03/2020) for DM and HTN, hyperlipidemia (fasting, ).  Alysia Penna, NP

## 2020-06-03 NOTE — Telephone Encounter (Signed)
Pt calling to request a refill on medication, as she forgot to ask during her visit today. Last fill 03/18/20 #10/0

## 2020-06-03 NOTE — Assessment & Plan Note (Signed)
Improved with lyrica and mobic Refill sent 

## 2020-06-03 NOTE — Assessment & Plan Note (Signed)
Improving BP with valsartan and diet modification. BP Readings from Last 3 Encounters:  06/03/20 140/70  04/30/20 (!) 176/100  03/18/20 (!) 156/71   Repeat BMP Maintain current medication dose

## 2020-06-03 NOTE — Patient Instructions (Signed)
No indication of UTI today. Urine sent for culture  Go to lab for blood draw  Maintain current medications

## 2020-06-03 NOTE — Assessment & Plan Note (Signed)
Improved with lyrica and mobic Refill sent

## 2020-06-03 NOTE — Assessment & Plan Note (Addendum)
States she is compliant with levothyroxine Repeat TSH and T4

## 2020-06-04 LAB — URINE CULTURE
MICRO NUMBER:: 11956073
SPECIMEN QUALITY:: ADEQUATE

## 2020-06-04 LAB — BASIC METABOLIC PANEL
BUN: 56 mg/dL — ABNORMAL HIGH (ref 6–23)
CO2: 16 mEq/L — ABNORMAL LOW (ref 19–32)
Calcium: 9.4 mg/dL (ref 8.4–10.5)
Chloride: 107 mEq/L (ref 96–112)
Creatinine, Ser: 2.3 mg/dL — ABNORMAL HIGH (ref 0.40–1.20)
GFR: 20.75 mL/min — ABNORMAL LOW (ref >60.00)
Glucose, Bld: 90 mg/dL (ref 70–99)
Potassium: 4.7 mEq/L (ref 3.5–5.1)
Sodium: 139 mEq/L (ref 135–145)

## 2020-06-04 LAB — CK: Total CK: 34 U/L (ref 7–177)

## 2020-06-04 MED ORDER — LEVOTHYROXINE SODIUM 150 MCG PO TABS: 150.0000 ug | ORAL_TABLET | Freq: Every day | ORAL | 1 refills | Status: AC

## 2020-06-04 NOTE — Telephone Encounter (Signed)
Left a detailed message on voicemail to inform pt of message below.  I also sent a message via Mychart.

## 2020-06-04 NOTE — Addendum Note (Signed)
Addended by: Michaela Corner on: 06/04/2020 03:42 PM   Modules accepted: Orders

## 2020-06-04 NOTE — Telephone Encounter (Signed)
HYDROcodone-acetaminophen (NORCO/VICODIN) 5-325 MG tablet ?

## 2020-06-04 NOTE — Telephone Encounter (Signed)
I will not be refill this medication. I recommend use of lyrica and mobic only at this time. I will be glad to enter referral to pain clinic if she wants to continue use of this medication

## 2020-06-11 ENCOUNTER — Other Ambulatory Visit: Payer: Medicare Other

## 2020-06-17 ENCOUNTER — Other Ambulatory Visit: Payer: Medicare Other

## 2020-06-19 ENCOUNTER — Other Ambulatory Visit: Payer: Self-pay

## 2020-06-19 ENCOUNTER — Ambulatory Visit
Admission: RE | Admit: 2020-06-19 | Discharge: 2020-06-19 | Disposition: A | Discharge status: Home / Self Care | Payer: Medicare Other | Source: Ambulatory Visit | Attending: Vascular Surgery | Admitting: Vascular Surgery

## 2020-06-19 DIAGNOSIS — K449 Diaphragmatic hernia without obstruction or gangrene: Secondary | ICD-10-CM | POA: Diagnosis not present

## 2020-06-19 DIAGNOSIS — G8929 Other chronic pain: Secondary | ICD-10-CM

## 2020-06-19 DIAGNOSIS — K5730 Diverticulosis of large intestine without perforation or abscess without bleeding: Secondary | ICD-10-CM | POA: Diagnosis not present

## 2020-06-19 DIAGNOSIS — I714 Abdominal aortic aneurysm, without rupture, unspecified: Secondary | ICD-10-CM

## 2020-06-19 DIAGNOSIS — I700 Atherosclerosis of aorta: Secondary | ICD-10-CM

## 2020-06-19 DIAGNOSIS — K8080 Other cholelithiasis without obstruction: Secondary | ICD-10-CM | POA: Diagnosis not present

## 2020-06-22 ENCOUNTER — Telehealth: Payer: Self-pay | Admitting: Nurse Practitioner

## 2020-06-22 DIAGNOSIS — R935 Abnormal findings on diagnostic imaging of other abdominal regions, including retroperitoneum: Secondary | ICD-10-CM

## 2020-06-22 NOTE — Telephone Encounter (Signed)
-----   Message from Chuck Hint, MD sent at 06/22/2020 10:08 AM EDT ----- Regarding: CT finding This patient had a CT scan and the radiologist called a report to our office.   "There is apparent endometrial thickening to 10 mm. Recommend further evaluation with dedicated pelvic ultrasound."  I don't think that I ordered that CT scan.   Thanks Cari Caraway

## 2020-06-24 ENCOUNTER — Ambulatory Visit: Payer: Medicare Other | Admitting: Vascular Surgery

## 2020-07-09 ENCOUNTER — Ambulatory Visit: Payer: Medicare Other | Admitting: Vascular Surgery

## 2020-07-29 ENCOUNTER — Ambulatory Visit: Payer: Medicare Other | Admitting: Vascular Surgery

## 2020-09-01 ENCOUNTER — Telehealth: Payer: Self-pay

## 2020-09-01 NOTE — Telephone Encounter (Signed)
Abigail Ruiz says she sent this by email beause they do not fax, have you received this?

## 2020-09-01 NOTE — Telephone Encounter (Signed)
Rebeca Alert from [employer] is call to see if we rec'd the death certificate that needs to be signed by Claris Gower. She faxed it on 08/28/20 Her call back # is [phone]  Thanks

## 2020-09-02 NOTE — Telephone Encounter (Signed)
error 

## 2020-09-02 NOTE — Telephone Encounter (Signed)
They called back to check up on this and I asked Claris Gower and she said we dont do paper copies for death certificates anymore but that it is in [facility]. Claris Gower needed date and time of death which I called and asked for and the guy I talked to said he would get karen to call back. Clydie Braun called back and gave info to [place] and I gave info to [place]. Pt was found Aug 31, 2020 and time was 3:11pm

## 2020-09-03 ENCOUNTER — Ambulatory Visit: Payer: Medicare Other | Admitting: Nurse Practitioner

## 2020-09-03 DEATH — deceased

## 2020-09-12 ENCOUNTER — Telehealth: Payer: Self-pay

## 2020-09-12 NOTE — Telephone Encounter (Signed)
Unable to reach pt, unable to Lvm.  

## 2020-09-22 ENCOUNTER — Telehealth: Payer: Self-pay | Admitting: Nurse Practitioner

## 2020-09-22 NOTE — Telephone Encounter (Signed)
Called patient to schedule Annual Wellness Visit.  Mailbox full

## 2021-06-30 IMAGING — CT CT ABD-PELV W/O CM
1 of 2 series · 13 of 32 positions shown, 18 images · non-contrast
Comparison: March 18, 2020

CLINICAL DATA: Follow-up [facility]

EXAM:
CT ABDOMEN AND PELVIS WITHOUT CONTRAST
TECHNIQUE: Multidetector CT imaging of the abdomen and pelvis was performed
following the standard protocol without IV contrast.

[Series 2: abd/pelvis w/out (person_name) · axial · 0.77mm/px · z∈[-483,-108]mm · 13 of 85 slices shown, 18 images]
[im 5/85  soft-tissue]
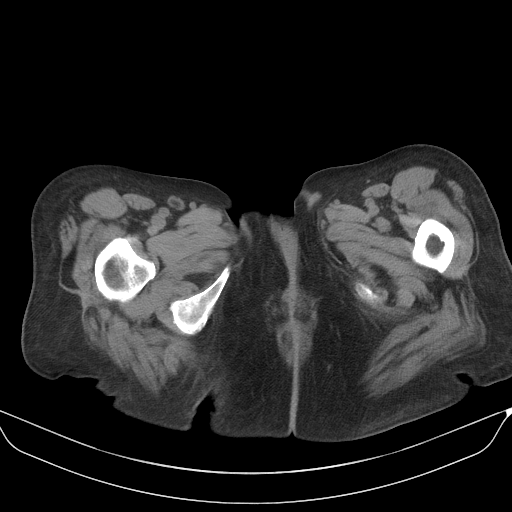
[im 5/85  bone]
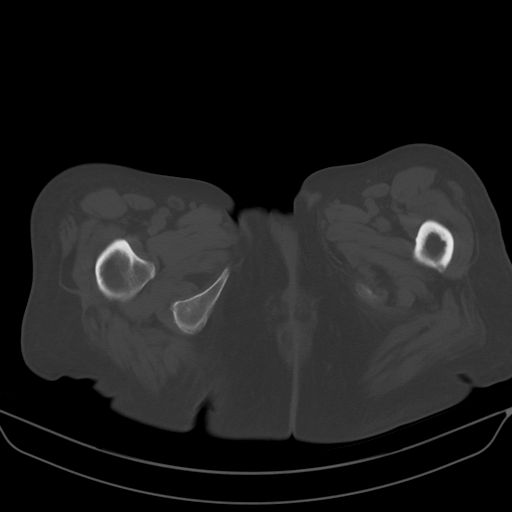
[im 13/85  soft-tissue]
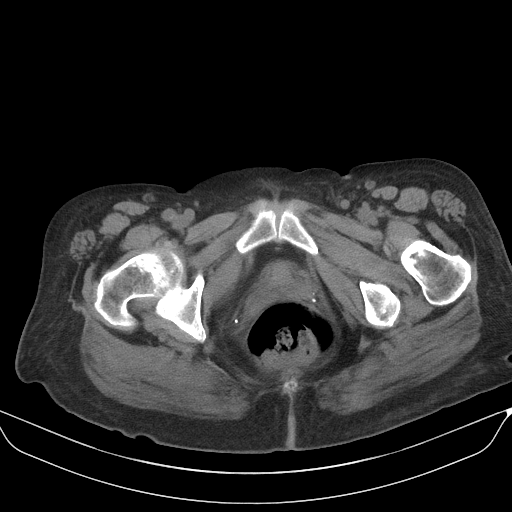
[im 17/85  soft-tissue]
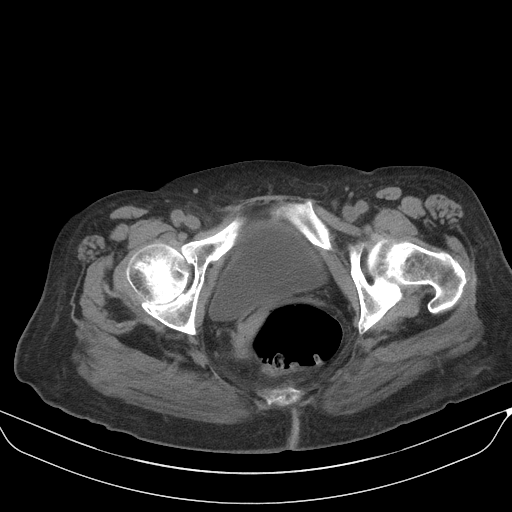
[im 26/85  soft-tissue]
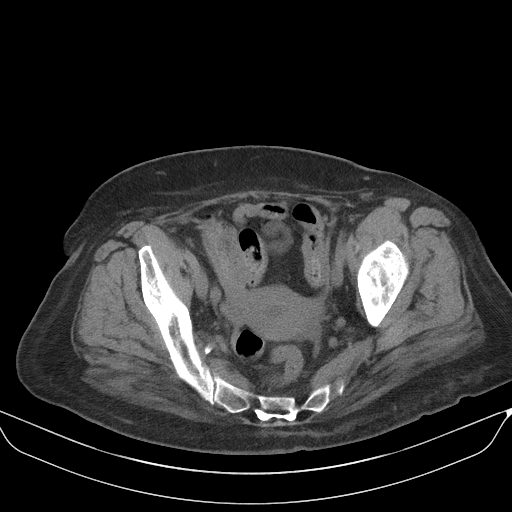
[im 34/85  soft-tissue]
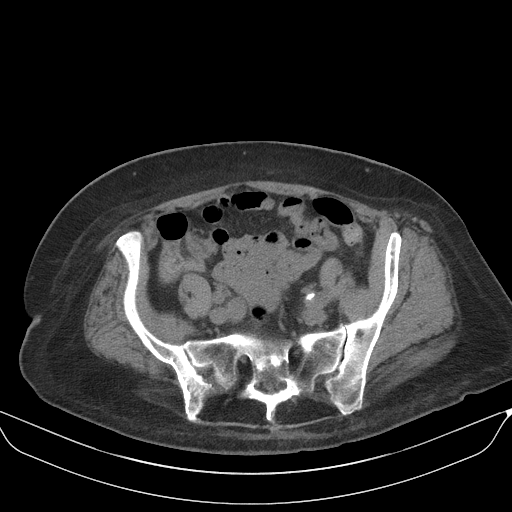
[im 38/85  soft-tissue]
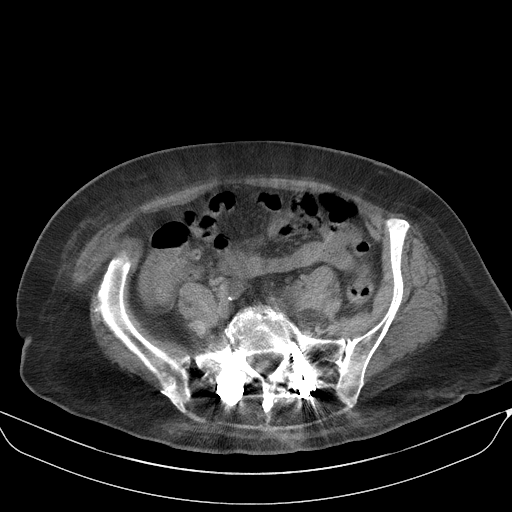
[im 47/85  soft-tissue]
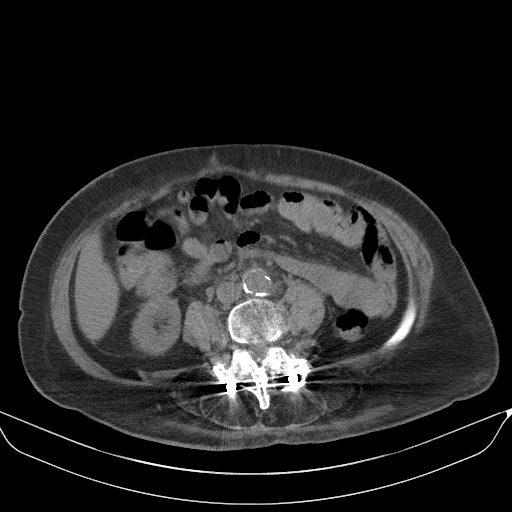
[im 51/85  soft-tissue]
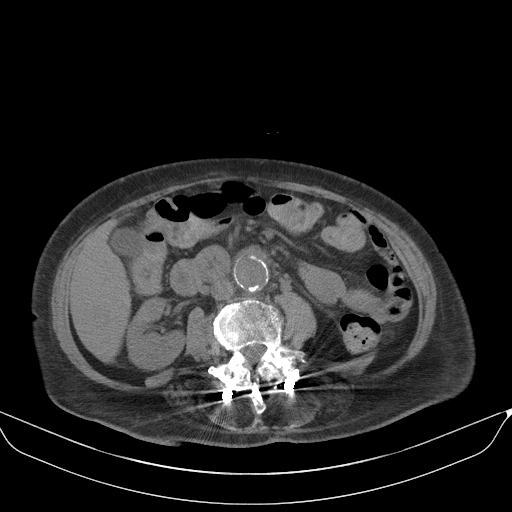
[im 59/85  soft-tissue]
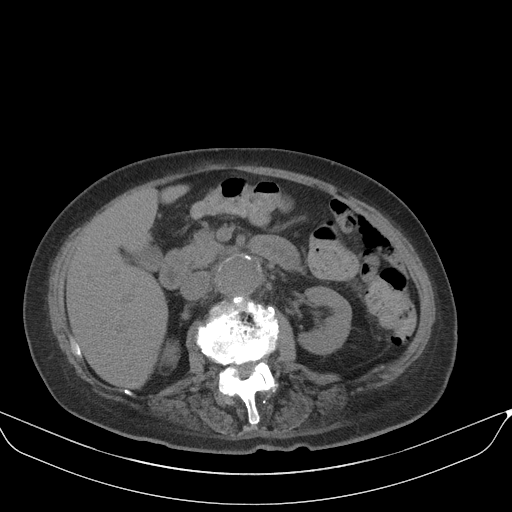
[im 59/85  bone]
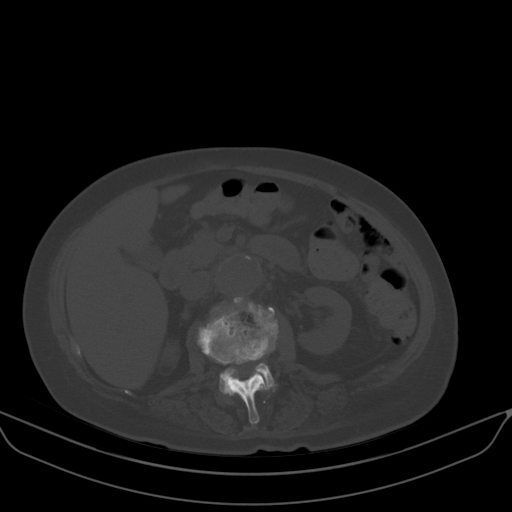
[im 68/85  soft-tissue]
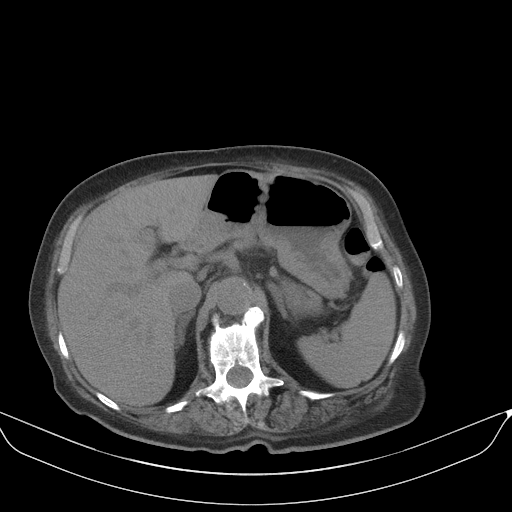
[im 68/85  lung]
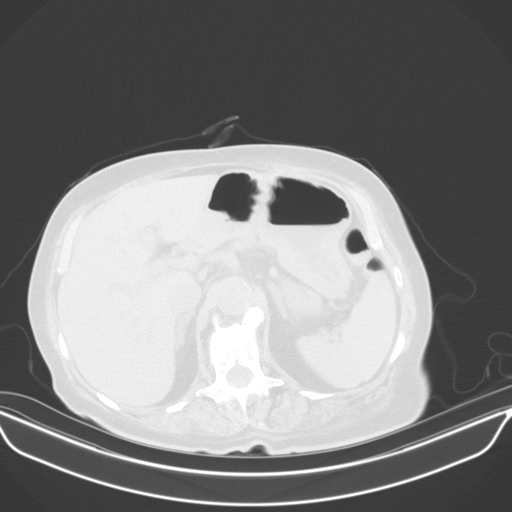
[im 72/85  soft-tissue]
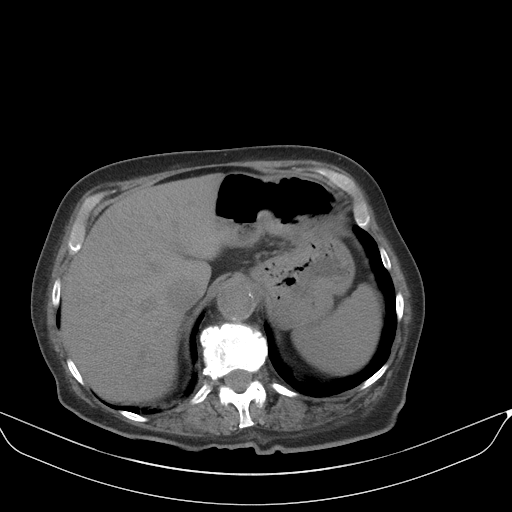
[im 72/85  lung]
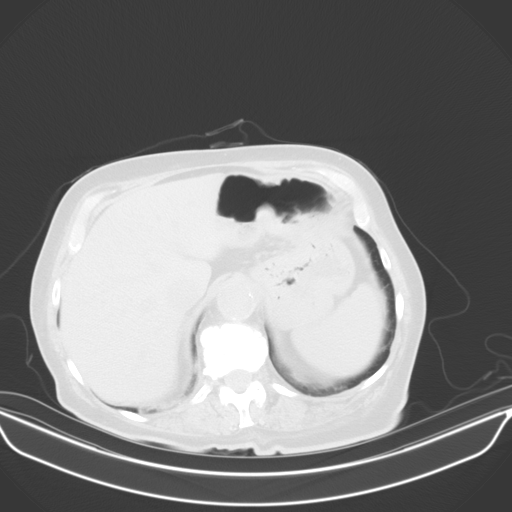
[im 76/85  lung]
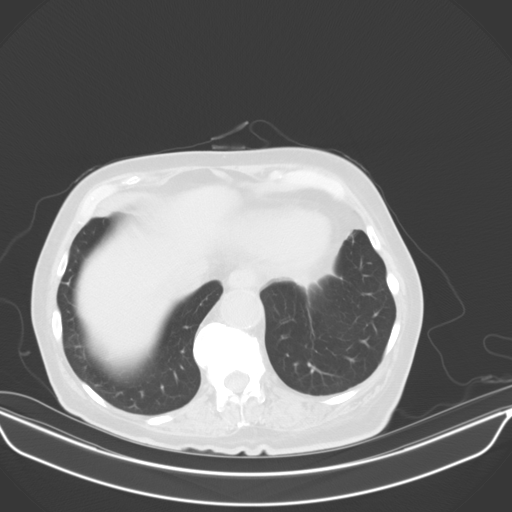
[im 80/85  soft-tissue]
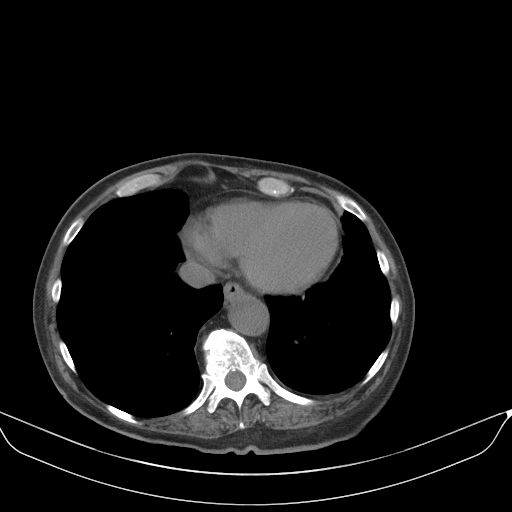
[im 80/85  lung]
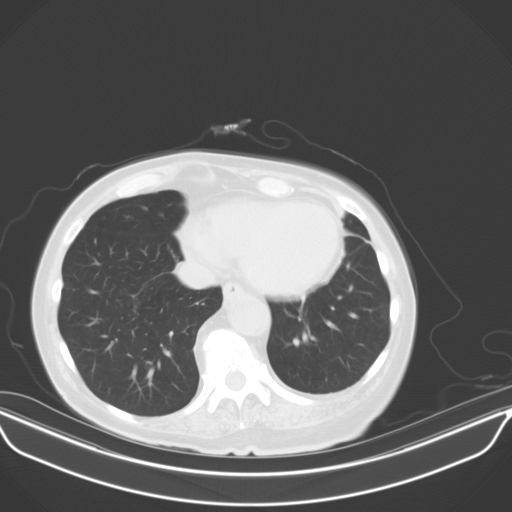

[13 of 32 positions shown; findings below may reference images not displayed]

FINDINGS: Evaluation is limited secondary lack of IV contrast.

Lower chest: Bibasilar atelectasis

Hepatobiliary: Layering cholelithiasis without evidence of
cholecystitis. Unremarkable noncontrast appearance of the liver.

Pancreas: No peripancreatic fat stranding.

Spleen: Unremarkable.

Adrenals/Urinary Tract: Unchanged nodularity bilateral adrenal
glands. Unchanged punctate calcification of the LEFT adrenal gland.
No hydronephrosis. No obstructing nephrolithiasis. Bladder is
unremarkable.

Stomach/Bowel: No evidence of bowel obstruction. Small hiatal
hernia. Duodenal lipoma. No evidence of appendicitis. Scattered
diverticulosis. Evaluation of bowel in the lower abdomen is limited
secondary to streak artifact and motion.

Vascular/Lymphatic: There is an unchanged aneurysm of the suprarenal
abdominal aorta at the level of the diaphragmatic crus to 3.4 cm.
Revisualization of a likely aneurysm aneurysm of the infrarenal
abdominal aorta which measures 3.6 x 3.0 cm, unchanged in comparison
to prior. Soft tissue component extending anterior to calcified
atherosclerotic plaque is unchanged in comparison to prior. Severe
atherosclerotic calcifications throughout the course of the aorta.
No new suspicious lymphadenopathy although evaluation is limited due
to streak artifact.

Reproductive: Apparent endometrial thickening to 10 mm.

Other: Small volume pelvic free fluid.

Musculoskeletal: Status post posterior fixation of the lower lumbar
spine. Multilevel degenerative changes of the thoracolumbar spine.
IMPRESSION: 1. Unchanged likely infrarenal abdominal aortic aneurysm to 3.6 cm
with crescentic soft tissue extending along anteriorly to the
calcified aortic wall. This could reflect intramural hematoma versus
retroperitoneal soft tissue. This could be further characterized
with dedicated contrast enhanced MRI given patient history of
anaphylaxis to CT contrast media.
2. There is an unchanged aneurysm at the level of the diaphragmatic
crus to 3.4 cm. Recommend continued attention on follow-up.
3. There is apparent endometrial thickening to 10 mm. Recommend
further evaluation with dedicated pelvic ultrasound.

These results will be called to the ordering clinician or
representative by the Radiologist Assistant, and communication
documented in the PACS or [REDACTED].
